# Patient Record
Sex: Male | Born: 1955
Health system: Southern US, Community
[De-identification: ages and names within clinical notes are randomized; demographics above are authoritative.]

## PROBLEM LIST (undated history)

## (undated) DIAGNOSIS — N400 Enlarged prostate without lower urinary tract symptoms: Secondary | ICD-10-CM

## (undated) DIAGNOSIS — F1021 Alcohol dependence, in remission: Secondary | ICD-10-CM

## (undated) DIAGNOSIS — R972 Elevated prostate specific antigen [PSA]: Secondary | ICD-10-CM

## (undated) DIAGNOSIS — C801 Malignant (primary) neoplasm, unspecified: Secondary | ICD-10-CM

## (undated) DIAGNOSIS — D126 Benign neoplasm of colon, unspecified: Secondary | ICD-10-CM

## (undated) DIAGNOSIS — K5790 Diverticulosis of intestine, part unspecified, without perforation or abscess without bleeding: Secondary | ICD-10-CM

## (undated) DIAGNOSIS — K219 Gastro-esophageal reflux disease without esophagitis: Secondary | ICD-10-CM

## (undated) DIAGNOSIS — K5792 Diverticulitis of intestine, part unspecified, without perforation or abscess without bleeding: Secondary | ICD-10-CM

## (undated) DIAGNOSIS — K648 Other hemorrhoids: Secondary | ICD-10-CM

## (undated) DIAGNOSIS — E785 Hyperlipidemia, unspecified: Secondary | ICD-10-CM

## (undated) HISTORY — DX: Diverticulosis of intestine, part unspecified, without perforation or abscess without bleeding: K57.90

## (undated) HISTORY — DX: Elevated prostate specific antigen (PSA): R97.20

## (undated) HISTORY — PX: PROSTATE BIOPSY: SHX241

## (undated) HISTORY — DX: Alcohol dependence, in remission: F10.21

## (undated) HISTORY — DX: Other hemorrhoids: K64.8

## (undated) HISTORY — DX: Benign prostatic hyperplasia without lower urinary tract symptoms: N40.0

## (undated) HISTORY — DX: Malignant (primary) neoplasm, unspecified: C80.1

## (undated) HISTORY — PX: POLYPECTOMY: SHX149

## (undated) HISTORY — PX: HEMORRHOID SURGERY: SHX153

## (undated) HISTORY — PX: SHOULDER ARTHROSCOPY: SHX128

## (undated) HISTORY — DX: Diverticulitis of intestine, part unspecified, without perforation or abscess without bleeding: K57.92

## (undated) HISTORY — DX: Hyperlipidemia, unspecified: E78.5

## (undated) HISTORY — DX: Gastro-esophageal reflux disease without esophagitis: K21.9

## (undated) HISTORY — PX: WISDOM TOOTH EXTRACTION: SHX21

## (undated) HISTORY — DX: Benign neoplasm of colon, unspecified: D12.6

---

## 1996-07-16 DIAGNOSIS — K5792 Diverticulitis of intestine, part unspecified, without perforation or abscess without bleeding: Secondary | ICD-10-CM

## 1996-07-16 HISTORY — DX: Diverticulitis of intestine, part unspecified, without perforation or abscess without bleeding: K57.92

## 1999-03-26 ENCOUNTER — Emergency Department (HOSPITAL_COMMUNITY): Admission: EM | Admit: 1999-03-26 | Discharge: 1999-03-26 | Payer: Self-pay | Admitting: Emergency Medicine

## 2002-11-06 ENCOUNTER — Emergency Department (HOSPITAL_COMMUNITY): Admission: EM | Admit: 2002-11-06 | Discharge: 2002-11-06 | Payer: Self-pay | Admitting: Emergency Medicine

## 2003-07-17 DIAGNOSIS — F1021 Alcohol dependence, in remission: Secondary | ICD-10-CM

## 2003-07-17 HISTORY — DX: Alcohol dependence, in remission: F10.21

## 2006-04-24 ENCOUNTER — Ambulatory Visit: Payer: Self-pay | Admitting: Internal Medicine

## 2007-06-17 ENCOUNTER — Encounter: Payer: Self-pay | Admitting: Internal Medicine

## 2007-11-05 ENCOUNTER — Encounter: Payer: Self-pay | Admitting: Internal Medicine

## 2007-11-06 ENCOUNTER — Encounter: Payer: Self-pay | Admitting: Internal Medicine

## 2007-12-01 ENCOUNTER — Encounter: Payer: Self-pay | Admitting: Gastroenterology

## 2007-12-01 ENCOUNTER — Ambulatory Visit: Payer: Self-pay | Admitting: Gastroenterology

## 2007-12-15 ENCOUNTER — Telehealth: Payer: Self-pay | Admitting: Gastroenterology

## 2007-12-15 DIAGNOSIS — D126 Benign neoplasm of colon, unspecified: Secondary | ICD-10-CM

## 2007-12-15 HISTORY — DX: Benign neoplasm of colon, unspecified: D12.6

## 2007-12-16 ENCOUNTER — Encounter: Payer: Self-pay | Admitting: Gastroenterology

## 2007-12-16 ENCOUNTER — Ambulatory Visit: Payer: Self-pay | Admitting: Gastroenterology

## 2007-12-17 ENCOUNTER — Encounter: Payer: Self-pay | Admitting: Gastroenterology

## 2008-07-16 HISTORY — PX: COLONOSCOPY: SHX174

## 2008-07-29 ENCOUNTER — Encounter: Payer: Self-pay | Admitting: Internal Medicine

## 2009-09-06 ENCOUNTER — Ambulatory Visit: Payer: Self-pay | Admitting: Pulmonary Disease

## 2009-11-07 ENCOUNTER — Ambulatory Visit: Payer: Self-pay | Admitting: Pulmonary Disease

## 2009-11-25 ENCOUNTER — Encounter: Payer: Self-pay | Admitting: Internal Medicine

## 2009-12-22 ENCOUNTER — Ambulatory Visit: Payer: Self-pay | Admitting: Internal Medicine

## 2010-02-24 ENCOUNTER — Telehealth (INDEPENDENT_AMBULATORY_CARE_PROVIDER_SITE_OTHER): Payer: Self-pay | Admitting: *Deleted

## 2010-03-13 ENCOUNTER — Encounter: Payer: Self-pay | Admitting: Internal Medicine

## 2010-03-27 ENCOUNTER — Ambulatory Visit: Payer: Self-pay | Admitting: Internal Medicine

## 2010-03-27 DIAGNOSIS — F988 Other specified behavioral and emotional disorders with onset usually occurring in childhood and adolescence: Secondary | ICD-10-CM | POA: Insufficient documentation

## 2010-03-27 DIAGNOSIS — N400 Enlarged prostate without lower urinary tract symptoms: Secondary | ICD-10-CM | POA: Insufficient documentation

## 2010-03-27 DIAGNOSIS — Z8719 Personal history of other diseases of the digestive system: Secondary | ICD-10-CM | POA: Insufficient documentation

## 2010-03-27 DIAGNOSIS — K219 Gastro-esophageal reflux disease without esophagitis: Secondary | ICD-10-CM | POA: Insufficient documentation

## 2010-08-15 NOTE — Assessment & Plan Note (Signed)
Summary: cpx//kn   Vital Signs:  Patient profile:   55 year old male Height:      72.75 inches Weight:      226 pounds BMI:     30.13 Temp:     98.3 degrees F oral Pulse rate:   84 / minute Resp:     14 per minute BP sitting:   134 / 82  (left arm) Cuff size:   large  Vitals Entered By: Shonna Chock CMA (March 27, 2010 9:50 AM) CC: CPX-not fasting ( had coffee and a danish) Comments Height recorded with shoes    Primary Care Provider:  Marga Melnick MD  CC:  CPX-not fasting ( had coffee and a danish).  History of Present Illness: Mr. "Walter Scott"  Huckeby is here for a physical; he is asymptomatic. He has been tested by Dr Vernell Leep & documented to have ADD. Ritalin is being considered as a therapeutic option.  Preventive Screening-Counseling & Management  Alcohol-Tobacco     Smoking Status: quit  Caffeine-Diet-Exercise     Does Patient Exercise: yes  Current Medications (verified): 1)  Avodart 0.5 Mg Caps (Dutasteride) .... Take 1 Tablet By Mouth Once A Day 2)  Protonix 40 Mg Tbec (Pantoprazole Sodium) .... Take 1 Tablet By Mouth Once A Day Before Dinner  Allergies (verified): 1)  ! Vyvanse (Lisdexamfetamine Dimesylate)  Past History:  Past Medical History: ADD, tested 2011, Dr Vernell Leep Diverticulitis, hx of 1998 Benign prostatic hypertrophy, elevated PSA, PMH of GERD Rcovering Alcoholic; [facility] 2005  Past Surgical History: Prostate biopsy 2006 & 2008, Dr Retta Diones; Colonoscopy 2009 : Diverticulosis  Family History: Father: died of ? 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 Eyes:  Denies blurring, double vision, and vision loss-both eyes. ENT:  Denies difficulty swallowing and hoarseness. CV:  Denies chest pain or discomfort, leg cramps with exertion, palpitations, shortness of breath with exertion, swelling of feet, and swelling of hands. Resp:  Denies cough and sputum productive. GI:  Denies abdominal pain, bloody stools, constipation, dark tarry stools, diarrhea, loss of appetite, and nausea; No dysphagia. GU:  Denies discharge, dysuria, and hematuria; Dr Retta Diones seen 2X/year. MS:  Denies joint pain, joint redness, joint swelling, low back pain, mid back pain, and thoracic pain. Derm:  Denies changes in nail beds, dryness, hair loss, and lesion(s). Neuro:  Denies numbness and tingling. Psych:  Denies anxiety, depression, easily angered, easily tearful, and irritability. Endo:  Denies cold intolerance, excessive hunger, excessive thirst, excessive urination, and heat intolerance. Heme:  Denies abnormal bruising, bleeding, and enlarge lymph nodes. Allergy:  Denies itching eyes and sneezing.  Physical Exam  General:  well-nourished; alert,appropriate and cooperative throughout examination Head:  Normocephalic and atraumatic without obvious abnormalities. No apparent alopecia  Eyes:  No corneal or conjunctival inflammation noted. EOMI. Perrla. Funduscopic exam benign, without hemorrhages, exudates or papilledema.No lid lag Ears:  External ear exam shows no significant lesions or deformities.  Otoscopic examination reveals clear canals, tympanic membranes are intact bilaterally without bulging, retraction, inflammation or discharge. Hearing is grossly normal bilaterally. Nose:  External nasal examination shows no deformity or  inflammation. Nasal mucosa are pink and moist without lesions or exudates. Mouth:  Oral mucosa and oropharynx without lesions or exudates.  Teeth in good repair. Neck:   No deformities, masses, or tenderness noted. No thyroid  Lungs:  Normal respiratory effort, chest expands symmetrically. Lungs are clear to auscultation, no crackles or wheezes. Heart:  Normal rate and regular rhythm. S1 and S2 normal without gallop, murmur, click, rub .S4 with slurring Abdomen:  Bowel sounds positive,abdomen soft and non-tender without masses, organomegaly or hernias noted. Genitalia:  Dr Retta Diones  Msk:  No deformity or scoliosis noted of thoracic or lumbar spine.   Pulses:  R and L carotid,radial,dorsalis pedis and posterior tibial pulses are full and equal bilaterally Extremities:  No clubbing, cyanosis,edema, or deformity noted with normal full range of motion of all joints.  No oncholysis Neurologic:  alert & oriented X3 and DTRs symmetrical and normal.  No tremor Skin:  Intact without suspicious lesions or rashes Cervical Nodes:  No lymphadenopathy noted Axillary Nodes:  No palpable lymphadenopathy Psych:  memory intact for recent and remote, normally interactive, good eye contact, not anxious appearing, and not depressed appearing.     Impression & Recommendations:  Problem # 1:  ROUTINE GENERAL MEDICAL EXAM@HEALTH  CARE FACL (ICD-V70.0)  Orders: EKG w/ Interpretation (93000)  Problem # 2:  ADD (ICD-314.00) DOCUMENTED by Dr Bascom Levels  Problem # 3:  GERD (ICD-530.81)  His updated medication list for this problem includes:    Protonix 40 Mg Tbec (Pantoprazole sodium) .Marland Kitchen... Take 1 tablet by mouth once a day  30 min pre b'fast  Problem # 4:  BENIGN PROSTATIC HYPERTROPHY (ICD-600.00) as per Dr Retta Diones His updated medication list for this problem includes:    Avodart 0.5 Mg Caps (Dutasteride) .Marland Kitchen... Take 1 tablet by mouth once a day  Complete Medication List: 1)  Avodart 0.5 Mg Caps (Dutasteride) .... Take 1 tablet by mouth once a day 2)  Protonix 40 Mg Tbec (Pantoprazole sodium) .... Take 1 tablet by mouth once a day  30 min pre b'fast 3)  Methylphenidate Hcl  10 Mg Tabs (Methylphenidate hcl) .Marland Kitchen.. 1 each am ; repeat if needed ( not within 8 hrs of at bedtime )  Other Orders: Tdap => 81yrs IM (14782) Admin 1st Vaccine (95621)  Patient Instructions: 1)  Consider baseline fasting labs: 2)  BMP; 3)  Hepatic Panel ; 4)  Lipid Panel; 5)  TSH ; 6)  CBC w/ Diff. 7)  Avoid foods high in acid (tomatoes, citrus juices, spicy foods). Avoid eating within two hours of lying down or before exercising. Do not over eat; try smaller more frequent meals. Elevate head of bed twelve inches when sleeping. 8)  Check your Blood Pressure regularly. If it is above: 135/85 ON AVERAGE  you should make an appointment. Prescriptions: METHYLPHENIDATE HCL 10 MG TABS (METHYLPHENIDATE HCL) 1 each am ; repeat if needed ( not within 8 hrs of at bedtime )  #60 x 0   Entered and Authorized by:   Marga Melnick MD   Signed by:   Marga Melnick MD on 03/27/2010   Method used:   Print then Give to Patient   RxID:   [id number] PROTONIX 40 MG TBEC (PANTOPRAZOLE SODIUM) Take 1 tablet by mouth once a day  30 min pre b'fast  #30 x 5   Entered and Authorized by:   Marga Melnick MD   Signed by:   Marga Melnick MD on 03/27/2010   Method used:   Print  then Give to Patient   RxID:   [id number]    Immunizations Administered:  Tetanus Vaccine:    Vaccine Type: Tdap    Site: right deltoid    Mfr: GlaxoSmithKline    Dose: 0.5 ml    Route: IM    Given by: Shonna Chock CMA    Exp. Date: 04/05/2012    Lot #: [id number]    VIS given: 06/02/08 version given March 27, 2010.

## 2010-08-15 NOTE — Letter (Signed)
Summary: [facility]  [facility]   Imported By: Lanelle Bal 03/29/2010 10:48:55  _____________________________________________________________________  External Attachment:    Type:   Image     Comment:   External Document

## 2010-08-15 NOTE — Progress Notes (Signed)
Summary: Request to change meds  Phone Note Call from Patient Call back at Home Phone [phone]   Caller: Patient Summary of Call: Patient spoke with his psych doctor and they decided Vyvanse is too much of a stimulant, patient said they think Ritlan at a middle dose will be appropriate.  Patient would like for Dr.Hopper to prescribe this, [facility] on [address]  Dr.Hopper please advise./Chrae Marlynn Perking CMA  February 24, 2010 2:24 PM   Follow-up for Phone Call        I left a message on VM for patient informing him that per Dr.Hopper:  The Psych doctor needs to send him specific recommendations in writing for him to prescribe this, unfortunately this is medical standards as per the [facility] Medical Board.  I left the phone and fax number for patient to contact us if any questions or concerns Follow-up by: Shonna Chock CMA,  February 24, 2010 5:27 PM     Appended Document: Request to change meds I received Dr Janey Greaser letter documenting ADD &  recommending Ritalin. I reviewed entire chart ; because of age , EKG & cardiac status evaluation indicated before starting Ritalin due to known  potential adverse effects of this stimulant , particularly in adults. This is standard required for these drugs in adults.

## 2010-08-15 NOTE — Assessment & Plan Note (Signed)
Summary: chronic cough x 15 years/self ref/apc   Visit Type:  Initial Consult Copy to:  self Primary Provider/Referring Provider:  Marga Melnick MD  CC:  Pt here for pulmonary consult. Pt c/o dry hacking cough x 15 years.  History of Present Illness: 53/M never smoker for evaluation of chronic cough x 15 years. This started when he was in  [place] in 1991 & he thinks there may have been mildew exposure, persisted during his stay in [place] & [place] & now in [place] last 10 years. he reports a full throat-ed cough, wores at night , no seasonal variation. He reports severe heartburn at least once/d, worse with [place] sauce, drinks coffee x 3 cups/d . Reports a post nasal drip again without seasonal variation.His  bride prompted him t o seek atention now. He has tried pulmicort/ albuterol nebs (his wife's children's) without benefit.  Current Medications (verified): 1)  Avodart 0.5 Mg Caps (Dutasteride) .... Take 1 Tablet By Mouth Once A Day  Allergies (verified): No Known Drug Allergies  Family History: none  Social History: Marital Status: married, lives with Reece Levy Children: yes Occupation: Minister/Educator  Review of Systems       The patient complains of non-productive cough and acid heartburn.  The patient denies shortness of breath with activity, shortness of breath at rest, productive cough, coughing up blood, chest pain, irregular heartbeats, indigestion, loss of appetite, weight change, abdominal pain, difficulty swallowing, sore throat, tooth/dental problems, headaches, nasal congestion/difficulty breathing through nose, sneezing, itching, ear ache, anxiety, depression, hand/feet swelling, joint stiffness or pain, rash, change in color of mucus, and fever.    Vital Signs:  Patient profile:   55 year old male Height:      71 inches Weight:      229.38 pounds BMI:     32.11 O2 Sat:      96 % on Room air Temp:     98.3 degrees F oral Pulse rate:   74 /  minute BP sitting:   118 / 90  (left arm) Cuff size:   regular  Vitals Entered By: Zackery Barefoot CMA (September 06, 2009 3:56 PM)  O2 Flow:  Room air CC: Pt here for pulmonary consult. Pt c/o dry hacking cough x 15 years Comments Medications reviewed with patient Verified contact number and pharmacy with patient Zackery Barefoot CMA  September 06, 2009 3:59 PM    Physical Exam  Additional Exam:  Gen. Pleasant, well-nourished, in no distress, normal affect ENT - no lesions, no post nasal drip Neck: No JVD, no thyromegaly, no carotid bruits Lungs: no use of accessory muscles, no dullness to percussion, clear without rales or rhonchi  Cardiovascular: Rhythm regular, heart sounds  normal, no murmurs or gallops, no peripheral edema Abdomen: soft and non-tender, no hepatosplenomegaly, BS normal. Musculoskeletal: No deformities, no cyanosis or clubbing Neuro:  alert, non focal     Impression & Recommendations:  Problem # 1:  COUGH (ICD-786.2)  Likely a combination of GERD & pper airway cough syndrome. Will use zyrtec for allergies Re-assess improvement in 6 wks, if none consider 1st gen anti-histaminic / decongestant combination.  Orders: Consultation Level III [phone]) Prescription Created Electronically [phone])  Problem # 2:  G E REFLUX (ICD-530.81)  His updated medication list for this problem includes:    Pantoprazole Sodium 40 Mg Tbec (Pantoprazole sodium) ..... Once daily before dinner  Orders: Consultation Level III (09811) Prescription Created Electronically [phone])  Medications Added to Medication List This Visit:  1)  Avodart 0.5 Mg Caps (Dutasteride) .... Take 1 tablet by mouth once a day 2)  Pantoprazole Sodium 40 Mg Tbec (Pantoprazole sodium) .... Once daily before dinner 3)  Zyrtec Allergy 10 Mg Tabs (Cetirizine hcl) .... Once daily  Patient Instructions: 1)  Copy sent to: Dr Alwyn Ren 2)  Please schedule a follow-up appointment in 6 weeks. 3)  Acid  supressant x 6 wks 4)  Zyrtec x 6 wks Prescriptions: ZYRTEC ALLERGY 10 MG TABS (CETIRIZINE HCL) once daily  #30 x 2   Entered and Authorized by:   Comer Locket Vassie Loll MD   Signed by:   Comer Locket Vassie Loll MD on 09/06/2009   Method used:   Electronically to        [facility]  [phone]* (retail)       [address] [place], [place]  [address]       Ph: [phone] or [phone]       Fax: [phone]   RxID:   [id number] PANTOPRAZOLE SODIUM 40 MG TBEC (PANTOPRAZOLE SODIUM) once daily before dinner  #30 x 2   Entered and Authorized by:   Comer Locket Vassie Loll MD   Signed by:   Comer Locket Vassie Loll MD on 09/06/2009   Method used:   Electronically to        [facility]  [phone]* (retail)       [address] [place], [place]  [address]       Ph: [phone] or [phone]       Fax: [phone]   RxID:   [id number]

## 2010-08-15 NOTE — Letter (Signed)
Summary: [facility]  [facility]   Imported By: Lanelle Bal 12/02/2009 09:26:37  _____________________________________________________________________  External Attachment:    Type:   Image     Comment:   External Document

## 2010-08-15 NOTE — Assessment & Plan Note (Signed)
Summary: add testing//kn   Vital Signs:  Patient profile:   55 year old male Height:      73 inches Weight:      229.0 pounds Pulse rate:   76 / minute Resp:     15 per minute BP sitting:   114 / 68  (left arm) Cuff size:   large  Vitals Entered By: Shonna Chock (December 22, 2009 11:51 AM) CC: ADD Testing  Comments **HEIGHT recorded with shoes on**   Primary Care Provider:  Marga Melnick MD  CC:  ADD Testing .  History of Present Illness: Walter Scott officially diagnosed ADHD 3 years ago. He was on meds for 3 months; "I didn't want to do it being in recovery". He is now seeing Vernell Leep now; he recommended Vyvanse. Black Box Warnings reviewed; he expresses concern about risks.His concerns were discussed.  Allergies (verified): No Known Drug Allergies  Review of Systems General:  Denies fatigue, loss of appetite, sleep disorder, and weight loss. CV:  Denies chest pain or discomfort, leg cramps with exertion, palpitations, and shortness of breath with exertion. GI:  Denies constipation, diarrhea, and excessive appetite. Neuro:  Denies numbness, tingling, and tremors. Psych:  Denies anxiety, depression, easily angered, easily tearful, irritability, and panic attacks. Endo:  Complains of heat intolerance; denies cold intolerance, excessive hunger, excessive thirst, and excessive urination.  Physical Exam  General:  well-nourished; alert,appropriate and cooperative throughout examination Eyes:  No corneal or conjunctival inflammation noted. No lid lag Heart:  Normal rate and regular rhythm. S1 and S2 normal without gallop, murmur, click, rub or other extra sounds. Neurologic:  alert & oriented X3 and DTRs symmetrical and normal.   No tremor  Skin:  Intact without suspicious lesions or rashes Psych:  Oriented X3, memory intact for recent and remote, normally interactive, good eye contact, not anxious appearing, and not depressed appearing.     Impression &  Recommendations:  Problem # 1:  ADHD (ICD-314.01)  Complete Medication List: 1)  Avodart 0.5 Mg Caps (Dutasteride) .... Take 1 tablet by mouth once a day 2)  Protonix 40 Mg Tbec (Pantoprazole sodium) .... Take 1 tablet by mouth once a day before dinner 3)  Vyvanse 30 Mg Caps (Lisdexamfetamine dimesylate) .Marland Kitchen.. 1 once daily  Patient Instructions: 1)  Report Warning Signs as discussed Prescriptions: VYVANSE 30 MG CAPS (LISDEXAMFETAMINE DIMESYLATE) 1 once daily  #30 x 0   Entered and Authorized by:   Marga Melnick MD   Signed by:   Marga Melnick MD on 12/22/2009   Method used:   Print then Give to Patient   RxID:   [id number]

## 2010-08-15 NOTE — Assessment & Plan Note (Signed)
Summary: 6 weeks/ mbw   Visit Type:  Follow-up Copy to:  self Primary Provider/Referring Provider:  Marga Melnick MD  CC:  Pt here for follow up. Pt c/o PND unchanged. Pt states cough is better and heartburn is less.  History of Present Illness: 54//M never smoker for evaluation of chronic cough x 15 years. This started when he was in  [place] in 1991 & he thinks there may have been mildew exposure, persisted during his stay in [place] & [place] & now in [place] last 10 years. he reports a full throat-ed cough, wores at night , no seasonal variation. He reports severe heartburn at least once/d, worse with [place] sauce, drinks coffee x 3 cups/d . Reports a post nasal drip again without seasonal variation.His  bride prompted him t o seek atention now. He has tried pulmicort/ albuterol nebs (his wife's children's) without benefit.  November 07, 2009 9:12 AM  cough 80% betterwith zyrtec & protonix, post nasal drip persists but cough better, heartburn almost gone  Current Medications (verified): 1)  Avodart 0.5 Mg Caps (Dutasteride) .... Take 1 Tablet By Mouth Once A Day 2)  Zyrtec Allergy 10 Mg Tabs (Cetirizine Hcl) .... Take 1 Tablet By Mouth Once A Day 3)  Protonix 40 Mg Tbec (Pantoprazole Sodium) .... Take 1 Tablet By Mouth Once A Day Before Dinner  Allergies (verified): No Known Drug Allergies  Past History:  Social History: Last updated: 09/06/2009 Marital Status: married, lives with Reece Levy Children: yes Occupation: Minister/Educator  Review of Systems       The patient complains of prolonged cough.  The patient denies anorexia, fever, weight loss, weight gain, vision loss, decreased hearing, hoarseness, chest pain, syncope, dyspnea on exertion, peripheral edema, headaches, hemoptysis, abdominal pain, melena, hematochezia, severe indigestion/heartburn, hematuria, muscle weakness, suspicious skin lesions, difficulty walking, depression, unusual weight change, and  abnormal bleeding.    Vital Signs:  Patient profile:   55 year old male Height:      71 inches Weight:      230 pounds O2 Sat:      94 % on Room air Temp:     97.8 degrees F oral Pulse rate:   59 / minute BP sitting:   124 / 84  (left arm) Cuff size:   regular  Vitals Entered By: Zackery Barefoot CMA (November 07, 2009 8:57 AM)  O2 Flow:  Room air CC: Pt here for follow up. Pt c/o PND unchanged. Pt states cough is better and heartburn is less Comments Medications reviewed with patient Verified contact number and pharmacy with patient Zackery Barefoot CMA  November 07, 2009 8:58 AM    Physical Exam  Additional Exam:  Gen. Pleasant, well-nourished, in no distress, normal affect ENT - no lesions, no post nasal drip Neck: No JVD, no thyromegaly, no carotid bruits Lungs: no use of accessory muscles, no dullness to percussion, clear without rales or rhonchi  Cardiovascular: Rhythm regular, heart sounds  normal, no murmurs or gallops, no peripheral edema Musculoskeletal: No deformities, no cyanosis or clubbing      Impression & Recommendations:  Problem # 1:  COUGH (ICD-786.2)  Combination of GERD & upper airway cough syndrome ct protonix x 3 months ct zyrtec D x 6 wks then stop  Orders: Est. Patient Level III (60454) Prescription Created Electronically [phone])  Medications Added to Medication List This Visit: 1)  Zyrtec Allergy 10 Mg Tabs (Cetirizine hcl) .... Take 1 tablet by mouth once a day 2)  Zyrtec-d Allergy & Congestion 5-120 Mg Xr12h-tab (Cetirizine-pseudoephedrine) .... Once daily 3)  Protonix 40 Mg Tbec (Pantoprazole sodium) .... Take 1 tablet by mouth once a day before dinner  Patient Instructions: 1)  Please schedule a follow-up appointment in 31/2 months. 2)  Stay on zyrtec D x 6 weeks 3)  Stay on protonix x 3 months 4)  Call if worse 5)  Please schedule a follow-up appointment in 2 months. Prescriptions: ZYRTEC-D ALLERGY & CONGESTION 5-120 MG XR12H-TAB  (CETIRIZINE-PSEUDOEPHEDRINE) once daily  #42 x 1   Entered and Authorized by:   Comer Locket. Vassie Loll MD   Signed by:   Comer Locket Vassie Loll MD on 11/07/2009   Method used:   Electronically to        [facility]  [phone]* (retail)       [address] [place], [place]  [address]       Ph: [phone] or [phone]       Fax: [phone]   RxID:   [id number]

## 2011-06-27 ENCOUNTER — Encounter: Payer: Self-pay | Admitting: Internal Medicine

## 2011-06-27 ENCOUNTER — Ambulatory Visit (INDEPENDENT_AMBULATORY_CARE_PROVIDER_SITE_OTHER): Payer: 59 | Admitting: Internal Medicine

## 2011-06-27 VITALS — BP 146/88 | HR 66 | Temp 98.2°F | Wt 230.6 lb

## 2011-06-27 DIAGNOSIS — J310 Chronic rhinitis: Secondary | ICD-10-CM

## 2011-06-27 DIAGNOSIS — R030 Elevated blood-pressure reading, without diagnosis of hypertension: Secondary | ICD-10-CM

## 2011-06-27 DIAGNOSIS — R000 Tachycardia, unspecified: Secondary | ICD-10-CM

## 2011-06-27 MED ORDER — METOPROLOL TARTRATE 25 MG PO TABS: 25.0000 mg | ORAL_TABLET | Freq: Two times a day (BID) | ORAL | Status: DC

## 2011-06-27 NOTE — Patient Instructions (Signed)
Blood Pressure Goal  Ideally is an AVERAGE < 135/85. This AVERAGE should be calculated from @ least 5-7 BP readings taken @ different times of day on different days of week. You should not respond to isolated BP readings , but rather the AVERAGE for that week. Filled blood pressure medicine if the average is greater than 135/85 or if you continue to have a tachycardia.To prevent tachycardia avoid stimulants such as decongestants, diet pills, nicotine, or caffeine (coffee, tea, cola, or chocolate) to excess.   Plain Mucinex for thick secretions ;force NON dairy fluids . Use a Neti pot daily as needed for sinus congestion . Flonase 1 spray in each nostril twice a day as needed. Use the "crossover" technique as discussed

## 2011-06-27 NOTE — Progress Notes (Signed)
  Subjective:    Patient ID: Walter Scott, male    DOB: 05-27-1956, 55 y.o.   MRN: [id number]  HPI 6 months ago and yesterday his blood pressure was found to be elevated at the dental office. He was told yesterday that "it was in the stroke risk range". He does have a history of mild blood pressure elevations in the past; he has no history of hypertension. He has never been  on antihypertensive medications.  His mother had hypertension. There is a history of heart attack in both sides of the family; those nails were over 55 at the time of their infarction.  He's had a recent respiratory tract infection and has been on generic Tessalon and Flonase. He is also been taking NyQuil.  He does have ADD but is not taking any medications for this.      Review of Systems He denies chest pain, palpitations, shortness of breath, or edema. He has noted some racing of his heart in the last 10 days. He's also been under increased stress related to job issues.  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 He denies symptoms of active rhinosinusitis or bronchitis.     Objective:   Physical Exam he appears healthy and well-nourished and in no acute distress.  Nasal, oral, and alert and neurologic exams are normal.  He has no lymphadenopathy about the neck or axilla  Pupils are equally round and reactive to light. Fundal exam reveals no significant hypertensive changes.  Chest is clear without rhonchi, rales or meals. If no increased work of breathing.  His heart rate is actually slow with a regular rhythm. He has an S4 with slight slurring. There no murmurs.  All pulses are intact and he has no bruits.  There is no aortic aneurysm or renal artery bruits.  Extremities revealed no cyanosis, clubbing, or edema.        Assessment & Plan:   #1 elevated blood pressure with significant long-term risk. There is a family history of hypertension. Blood pressure monitor is essential. If  blood pressure averages greater than 135/85; low-dose metoprolol will be initiated. This would also address her tachycardia. No dysrhythmias present clinically this time.  #2 rhinitis symptoms without rhinosinusitis. He should verify that he is not ingesting decongestants which can cause tachycardia and raise blood pressure  #3 exogenous stress; medication decline

## 2011-08-22 ENCOUNTER — Encounter: Payer: Self-pay | Admitting: Internal Medicine

## 2011-08-22 ENCOUNTER — Ambulatory Visit (INDEPENDENT_AMBULATORY_CARE_PROVIDER_SITE_OTHER): Payer: 59 | Admitting: Internal Medicine

## 2011-08-22 VITALS — BP 134/86 | HR 64 | Temp 98.4°F | Resp 12 | Ht 72.0 in | Wt 231.0 lb

## 2011-08-22 DIAGNOSIS — Z0000 Encounter for general adult medical examination without abnormal findings: Secondary | ICD-10-CM

## 2011-08-22 LAB — LIPID PANEL
Cholesterol: 258 mg/dL — ABNORMAL HIGH (ref 0–200)
HDL: 39.9 mg/dL
Total CHOL/HDL Ratio: 6
Triglycerides: 337 mg/dL — ABNORMAL HIGH (ref 0.0–149.0)
VLDL: 67.4 mg/dL — ABNORMAL HIGH (ref 0.0–40.0)

## 2011-08-22 LAB — HEPATIC FUNCTION PANEL
ALT: 55 U/L — ABNORMAL HIGH (ref 0–53)
AST: 33 U/L (ref 0–37)
Albumin: 4.3 g/dL (ref 3.5–5.2)
Alkaline Phosphatase: 57 U/L (ref 39–117)
Bilirubin, Direct: 0.1 mg/dL (ref 0.0–0.3)
Total Bilirubin: 0.8 mg/dL (ref 0.3–1.2)
Total Protein: 7.1 g/dL (ref 6.0–8.3)

## 2011-08-22 LAB — BASIC METABOLIC PANEL
BUN: 24 mg/dL — ABNORMAL HIGH (ref 6–23)
Chloride: 106 mEq/L (ref 96–112)
GFR: 69.08 mL/min (ref >60.00)
Potassium: 4.5 mEq/L (ref 3.5–5.1)
Sodium: 140 mEq/L (ref 135–145)

## 2011-08-22 LAB — CBC WITH DIFFERENTIAL/PLATELET
Basophils Relative: 0.4 % (ref 0.0–3.0)
Eosinophils Relative: 1 % (ref 0.0–5.0)
HCT: 44.4 % (ref 39.0–52.0)
Hemoglobin: 15.4 g/dL (ref 13.0–17.0)
Lymphs Abs: 3.1 10*3/uL (ref 0.7–4.0)
MCV: 89.6 fl (ref 78.0–100.0)
Monocytes Absolute: 0.6 10*3/uL (ref 0.1–1.0)
Monocytes Relative: 9.4 % (ref 3.0–12.0)
Neutro Abs: 3 10*3/uL (ref 1.4–7.7)
RBC: 4.95 Mil/uL (ref 4.22–5.81)
WBC: 6.8 10*3/uL (ref 4.5–10.5)

## 2011-08-22 LAB — LDL CHOLESTEROL, DIRECT: Direct LDL: 101.7 mg/dL

## 2011-08-22 LAB — TSH: TSH: 2.88 u[IU]/mL (ref 0.35–5.50)

## 2011-08-22 MED ORDER — RANITIDINE HCL 150 MG PO TABS: 150.0000 mg | ORAL_TABLET | Freq: Two times a day (BID) | ORAL | Status: DC

## 2011-08-22 NOTE — Patient Instructions (Signed)
Preventive Health Care: Exercise at least 30-45 minutes a day,  3-4 days a week.  Eat a low-fat diet with lots of fruits and vegetables, up to 7-9 servings per day. Consume less than 40 grams of sugar per day from foods & drinks with High Fructose Corn Sugar as # 1,2,3 or # 4 on label.  Your BP goal = AVERAGE < 135/85. Avoid ingestion of  excess salt/sodium.Cook with pepper & other spices . Use the salt substitute "No Salt"(unless your potassium has been elevated) OR the Mrs [name] products to season food @ the table. Avoid foods which taste salty or "vinegary" as their sodium contentet will be high.   Flonase1 spray in each nostril twice a day as needed. Use the "crossover" technique as discussed  The triggers for reflux  include stress; the "aspirin family" ; alcohol; peppermint; and caffeine (coffee, tea, cola, and chocolate). The aspirin family would include aspirin and the nonsteroidal agents such as ibuprofen &  Naproxen. Tylenol would not cause reflux. If having reflux ; food & drink should be avoided for @ least 2 hours before going to bed.

## 2011-08-22 NOTE — Progress Notes (Signed)
Subjective:    Patient ID: Walter Scott, male    DOB: 1956/02/08, 56 y.o.   MRN: [id number]  HPI Mr. Kau  is here for a physical; acute issues include cough due to post nasal drainage and hoarseness. He denies extrinsic symptoms such as sneezing oritchy watery eyes.      Review of Systems Patient reports no  vision/ hearing changes,anorexia, weight change, fever ,adenopathy, persistant / recurrent  swallowing issues, chest pain,palpitations, edema,persistant / recurrent cough, hemoptysis, dyspnea(rest, exertional, paroxysmal nocturnal), gastrointestinal  bleeding (melena, rectal bleeding), abdominal pain, excessive heart burn, GU symptoms( dysuria, hematuria, pyuria, voiding/incontinence  issues) syncope, focal weakness, memory loss,numbness & tingling, skin/hair/nail changes,depression, anxiety, abnormal bruising/bleeding, musculoskeletal symptoms/signs.  He has been monitoring his blood pressure at work; range is then 121/83-146/98. He is not calculated an average blood pressure. He wants to control any blood pressure elevations with lifestyle interventions. He does have some reflux symptoms at night.     Objective:   Physical Exam Gen.: Healthy and well-nourished in appearance. Alert, appropriate and cooperative throughout exam. Head: Normocephalic without obvious abnormalities; no alopecia  Eyes: No corneal or conjunctival inflammation noted. Pupils equal round reactive to light and accommodation. Fundal exam is benign without hemorrhages, exudate, papilledema. Extraocular motion intact. Vision grossly decreased OS. Ears: External  ear exam reveals no significant lesions or deformities. Canals clear .TMs normal. Hearing is grossly normal bilaterally. Nose: External nasal exam reveals no deformity or inflammation. Nasal mucosa are pink and moist. No lesions or exudates noted. Septum  normal  Mouth: Oral mucosa and oropharynx reveal no lesions or exudates. Teeth in good repair. Neck: No  deformities, masses, or tenderness noted. Range of motion & Thyroid normal. Lungs: Normal respiratory effort; chest expands symmetrically. Lungs are clear to auscultation without rales, wheezes, or increased work of breathing. Heart: Normal rate and rhythm. Normal S1 and S2. No gallop, click, or rub. No murmur. Abdomen: Bowel sounds normal; abdomen soft and nontender. No masses, organomegaly or hernias noted. Genitalia:Dr Dalstedt  .                                                                                   Musculoskeletal/extremities: No deformity or scoliosis noted of  the thoracic or lumbar spine. No clubbing, cyanosis, edema, or deformity noted. Range of motion  normal .Tone & strength  normal.Joints normal. Nail health  good. Vascular: Carotid, radial artery, dorsalis pedis and  posterior tibial pulses are full and equal. No bruits present. Neurologic: Alert and oriented x3. Deep tendon reflexes symmetrical and normal.          Skin: Intact without suspicious lesions or rashes. Lymph: No cervical or axillary lymphadenopathy present. Psych: Mood and affect are normal. Normally interactive  Assessment & Plan:  #1 comprehensive physical exam; no acute findings #2 see Problem List with Assessments & Recommendations  #3 cough in the context of perennial rhinitis Plan: see Orders

## 2012-08-22 ENCOUNTER — Encounter: Payer: Self-pay | Admitting: Internal Medicine

## 2012-08-22 ENCOUNTER — Ambulatory Visit (INDEPENDENT_AMBULATORY_CARE_PROVIDER_SITE_OTHER): Payer: BC Managed Care – PPO | Admitting: Internal Medicine

## 2012-08-22 VITALS — BP 126/90 | HR 67 | Temp 97.9°F | Resp 14 | Ht 73.0 in | Wt 229.0 lb

## 2012-08-22 DIAGNOSIS — Z0000 Encounter for general adult medical examination without abnormal findings: Secondary | ICD-10-CM

## 2012-08-22 DIAGNOSIS — R002 Palpitations: Secondary | ICD-10-CM

## 2012-08-22 DIAGNOSIS — R030 Elevated blood-pressure reading, without diagnosis of hypertension: Secondary | ICD-10-CM

## 2012-08-22 LAB — CBC WITH DIFFERENTIAL/PLATELET
Basophils Absolute: 0 10*3/uL (ref 0.0–0.1)
Eosinophils Absolute: 0.1 10*3/uL (ref 0.0–0.7)
Lymphocytes Relative: 49.5 % — ABNORMAL HIGH (ref 12.0–46.0)
MCHC: 35 g/dL (ref 30.0–36.0)
Monocytes Relative: 9.7 % (ref 3.0–12.0)
Neutro Abs: 2.9 10*3/uL (ref 1.4–7.7)
Neutrophils Relative %: 39.2 % — ABNORMAL LOW (ref 43.0–77.0)
Platelets: 189 10*3/uL (ref 150.0–400.0)
RDW: 12.8 % (ref 11.5–14.6)

## 2012-08-22 LAB — HEPATIC FUNCTION PANEL
AST: 35 U/L (ref 0–37)
Albumin: 4.2 g/dL (ref 3.5–5.2)
Alkaline Phosphatase: 55 U/L (ref 39–117)
Bilirubin, Direct: 0 mg/dL (ref 0.0–0.3)
Total Bilirubin: 0.6 mg/dL (ref 0.3–1.2)

## 2012-08-22 LAB — BASIC METABOLIC PANEL
BUN: 25 mg/dL — ABNORMAL HIGH (ref 6–23)
CO2: 25 mEq/L (ref 19–32)
Calcium: 9.1 mg/dL (ref 8.4–10.5)
Creatinine, Ser: 1.3 mg/dL (ref 0.4–1.5)
GFR: 63.77 mL/min (ref >60.00)
Glucose, Bld: 87 mg/dL (ref 70–99)
Sodium: 137 mEq/L (ref 135–145)

## 2012-08-22 LAB — T4, FREE: Free T4: 0.69 ng/dL (ref 0.60–1.60)

## 2012-08-22 LAB — LIPID PANEL
Cholesterol: 207 mg/dL — ABNORMAL HIGH (ref 0–200)
HDL: 32.8 mg/dL — ABNORMAL LOW
Total CHOL/HDL Ratio: 6
Triglycerides: 333 mg/dL — ABNORMAL HIGH (ref 0.0–149.0)
VLDL: 66.6 mg/dL — ABNORMAL HIGH (ref 0.0–40.0)

## 2012-08-22 LAB — MAGNESIUM: Magnesium: 2 mg/dL (ref 1.5–2.5)

## 2012-08-22 MED ORDER — FLUTICASONE PROPIONATE 50 MCG/ACT NA SUSP: 1.0000 | Freq: Two times a day (BID) | NASAL | Status: DC | PRN

## 2012-08-22 MED ORDER — RAMIPRIL 2.5 MG PO CAPS: 2.5000 mg | ORAL_CAPSULE | Freq: Every day | ORAL | Status: DC

## 2012-08-22 NOTE — Progress Notes (Signed)
Subjective:    Patient ID: Walter Scott, male    DOB: 1955-09-07, 57 y.o.   MRN: [id number]  HPI Walter Scott is here for a physical;acute issues are noted below.     Review of Systems He is on a modified heart healthy diet; he exercises > 45- 60  minutes 3-4 times per week without symptoms. Specifically he denies chest pain, palpitations, dyspnea, or claudication. Family history is negative for premature coronary disease. For approximately a month he's noted some brief palpitations (few seconds) after he gets into bed. He has not had the symptoms with the high level exercise noted above. In fact he recently completed a half marathon.  He also has some chronic postnasal drainage. He denies taking decongestants     Objective:   Physical Exam Gen.: Healthy and well-nourished in appearance. Alert, appropriate and cooperative throughout exam. Appears younger than stated age  Head: Normocephalic without obvious abnormalities;  no alopecia  Eyes: No corneal or conjunctival inflammation noted. Pupils equal round reactive to light and accommodation. Fundal exam is benign without hemorrhages, exudate, papilledema. Extraocular motion intact. Vision grossly normal. Ears: External  ear exam reveals no significant lesions or deformities. Canals clear .TMs normal. Hearing is grossly normal bilaterally. Nose: External nasal exam reveals no deformity or inflammation. Erythema R nares. No lesions or exudates noted.   Mouth: Oral mucosa and oropharynx reveal no lesions or exudates. Teeth in good repair. Neck: No deformities, masses, or tenderness noted. Range of motion & Thyroid normal. Lungs: Normal respiratory effort; chest expands symmetrically. Lungs are clear to auscultation without rales, wheezes, or increased work of breathing. Heart: Normal rate and rhythm. Normal S1 and S2. No gallop, click, or rub. Grade 1/2 over 6 systolic murmur  R base Abdomen: Bowel sounds normal; abdomen soft and nontender.  No masses, organomegaly or hernias noted. Genitalia: Dr Retta Diones                                    Musculoskeletal/extremities: No deformity or scoliosis noted of  the thoracic or lumbar spine. No clubbing, cyanosis, edema, or significant extremity  deformity noted. Range of motion normal .Tone & strength  normal.Joints normal . Nail health good. Able to lie down & sit up w/o help. Negative SLR bilaterally Vascular: Carotid, radial artery, dorsalis pedis and  posterior tibial pulses are full and equal. No bruits present. Neurologic: Alert and oriented x3. Deep tendon reflexes symmetrical and normal.  Skin: Intact without suspicious lesions or rashes. Lymph: No cervical, axillary lymphadenopathy present. Psych: Mood and affect are normal. Normally interactive                                                                                        Assessment & Plan:  #1 comprehensive physical exam  #2 palpitations at rest briefly at night. None with high level of exercise. As noted he is competing at high levels such as the half marathon. EKG does show some loss of the voltage in leads aVL and in the precordial leads of questionable significance. Despite the lack  of symptoms with exercise; I would recommend  a stress test prior to continuing the high level of physical activity.  #3 rhinitis; see recommendations in AVS  Plan: see Orders  & Recommendations

## 2012-08-22 NOTE — Patient Instructions (Addendum)
To prevent palpitations or premature beats, avoid stimulants such as decongestants, diet pills, nicotine, or caffeine (coffee, tea, cola, or chocolate) to excess.  Take the EKG to any emergency room or preop visits. There are nonspecific changes; as long as there is no new change these are not clinically significant . If the old EKG is not available for comparison; it may result in unnecessary hospitalization for observation with significant unnecessary expense.     Plain Mucinex (NOT D) for thick secretions ;force NON dairy fluids .   Nasal cleansing in the shower as discussed with lather of mild shampoo.After 10 seconds wash off lather while  exhaling through nostrils. Make sure that all residual soap is removed to prevent irritation.  Fluticasone 1 spray in each nostril twice a day as needed. Use the "crossover" technique into opposite nostril spraying toward opposite ear @ 45 degree angle, not straight up into nostril.  Use a Neti pot daily only  as needed for significant sinus congestion; going from open side to congested side . Plain Allegra (NOT D )  160 daily , Loratidine 10 mg , OR Zyrtec 10 mg @ bedtime  as needed for itchy eyes & sneezing.  Minimal Blood Pressure Goal= AVERAGE < 140/90;  Ideal is an AVERAGE < 135/85. This AVERAGE should be calculated from @ least 5-7 BP readings taken @ different times of day on different days of week. You should not respond to isolated BP readings , but rather the AVERAGE for that week .Please bring your  blood pressure cuff to office visits to verify that it is reliable.It  can also be checked against the blood pressure device at the pharmacy. Finger or wrist cuffs are not dependable; an arm cuff is.    If you activate My Chart; the results can be released to you as soon as they populate from the lab. If you choose not to use this program; the labs have to be reviewed, copied & mailed   causing a delay in getting the results to you.

## 2012-10-20 ENCOUNTER — Encounter: Payer: Self-pay | Admitting: Family Medicine

## 2012-10-20 ENCOUNTER — Ambulatory Visit (INDEPENDENT_AMBULATORY_CARE_PROVIDER_SITE_OTHER): Payer: BC Managed Care – PPO | Admitting: Family Medicine

## 2012-10-20 VITALS — BP 110/80 | HR 68 | Temp 98.3°F | Ht 72.5 in | Wt 230.2 lb

## 2012-10-20 DIAGNOSIS — J329 Chronic sinusitis, unspecified: Secondary | ICD-10-CM | POA: Insufficient documentation

## 2012-10-20 MED ORDER — PROMETHAZINE-DM 6.25-15 MG/5ML PO SYRP: 5.0000 mL | ORAL_SOLUTION | Freq: Four times a day (QID) | ORAL | Status: DC | PRN

## 2012-10-20 MED ORDER — AZITHROMYCIN 250 MG PO TABS: ORAL_TABLET | ORAL | Status: DC

## 2012-10-20 NOTE — Assessment & Plan Note (Signed)
New.  Start abx.  Cough meds prn.  Reviewed supportive care and red flags that should prompt return.  Pt expressed understanding and is in agreement w/ plan.  

## 2012-10-20 NOTE — Progress Notes (Signed)
  Subjective:    Patient ID: Walter Scott, male    DOB: 02-Dec-1955, 56 y.o.   MRN: [id number]  HPI URI- sxs started Thursday w/ sore throat (has improved), Friday developed cough.  'it's really in my chest', 'tighter than usual'.  Cough is not productive, sleep is difficult.  No fevers.  + sinus pain.  No ear pain.  + sick contacts.  No N/V/D.  Pt w/ hx of bronchitis.  Has taken Advil cold and sinus, Nyquil, Mucinex w/out relief.   Review of Systems For ROS see HPI     Objective:   Physical Exam  Vitals reviewed. Constitutional: He appears well-developed and well-nourished. No distress.  HENT:  Head: Normocephalic and atraumatic.  Right Ear: Tympanic membrane normal.  Left Ear: Tympanic membrane normal.  Nose: Mucosal edema and rhinorrhea present. Right sinus exhibits maxillary sinus tenderness and frontal sinus tenderness. Left sinus exhibits maxillary sinus tenderness and frontal sinus tenderness.  Mouth/Throat: Mucous membranes are normal. Oropharyngeal exudate and posterior oropharyngeal erythema present. No posterior oropharyngeal edema.  + PND  Eyes: Conjunctivae and EOM are normal. Pupils are equal, round, and reactive to light.  Neck: Normal range of motion. Neck supple.  Cardiovascular: Normal rate, regular rhythm and normal heart sounds.   Pulmonary/Chest: Effort normal and breath sounds normal. No respiratory distress. He has no wheezes.  + hacking cough  Lymphadenopathy:    He has no cervical adenopathy.  Skin: Skin is warm and dry.          Assessment & Plan:

## 2012-10-20 NOTE — Patient Instructions (Addendum)
Start the Zpack as directed Drink plenty of fluids Use the cough syrup as needed- this will cause drowsiness REST! Hang in there!!!

## 2012-12-12 ENCOUNTER — Encounter: Payer: Self-pay | Admitting: Gastroenterology

## 2013-02-25 ENCOUNTER — Telehealth: Payer: Self-pay | Admitting: Internal Medicine

## 2013-02-25 NOTE — Telephone Encounter (Signed)
Patient Information:  Caller Name: Toshiro  Phone: [phone]  Patient: Walter Scott, Walter Scott  Gender: Male  DOB: 1956/07/04  Age: 57 Years  PCP: Marga Melnick  Office Follow Up:  Does the office need to follow up with this patient?: No  Instructions For The Office: N/A  RN Note:  No pain when tragus pushed or earlobe moved.  Currently in [employer] until after office hours 02/25/13. Transferred to office scheduler for 02/26/13 appointment then decided not to schedule unless symptoms worsen.   Symptoms  Reason For Call & Symptoms: Right ear pain. Pain increases when lying down.  Reviewed Health History In EMR: Yes  Reviewed Medications In EMR: Yes  Reviewed Allergies In EMR: Yes  Reviewed Surgeries / Procedures: Yes  Date of Onset of Symptoms: 02/23/2013  Treatments Tried: swimmers ear alcohol drops  Treatments Tried Worked: No  Guideline(s) Used:  Earache  Disposition Per Guideline:   See Today in Office  Reason For Disposition Reached:   All other earaches (Exceptions: earache lasting < 1 hour, and earache from air travel)  Advice Given:  Pain Medicines:  For pain relief, you can take either acetaminophen, ibuprofen, or naproxen.  Call Back If  High fever, severe headache, or stiff neck occurs  You become worse.  Patient Refused Recommendation:  Patient Refused Appt, Patient Requests Appt At Later Date  Transferred to office scheduler for appointment 02/26/13.

## 2013-02-25 NOTE — Telephone Encounter (Signed)
Noted, patient indicated he will call for appointment if symptoms persist or progress

## 2013-04-08 ENCOUNTER — Other Ambulatory Visit: Payer: Self-pay | Admitting: Internal Medicine

## 2013-04-08 NOTE — Telephone Encounter (Signed)
Med filled.  

## 2013-07-14 ENCOUNTER — Encounter: Payer: Self-pay | Admitting: Gastroenterology

## 2013-08-10 ENCOUNTER — Encounter: Payer: Self-pay | Admitting: Gastroenterology

## 2013-08-10 ENCOUNTER — Ambulatory Visit (INDEPENDENT_AMBULATORY_CARE_PROVIDER_SITE_OTHER): Payer: 59 | Admitting: Gastroenterology

## 2013-08-10 ENCOUNTER — Other Ambulatory Visit: Payer: Self-pay | Admitting: Internal Medicine

## 2013-08-10 VITALS — BP 110/80 | HR 84 | Ht 71.25 in | Wt 228.0 lb

## 2013-08-10 DIAGNOSIS — Z860100 Personal history of colon polyps, unspecified: Secondary | ICD-10-CM

## 2013-08-10 DIAGNOSIS — K219 Gastro-esophageal reflux disease without esophagitis: Secondary | ICD-10-CM

## 2013-08-10 DIAGNOSIS — R059 Cough, unspecified: Secondary | ICD-10-CM

## 2013-08-10 DIAGNOSIS — R05 Cough: Secondary | ICD-10-CM

## 2013-08-10 MED ORDER — SOD PICOSULFATE-MAG OX-CIT ACD 10-3.5-12 MG-GM-GM PO PACK: 1.0000 | PACK | ORAL | Status: DC

## 2013-08-10 MED ORDER — LOSARTAN POTASSIUM 50 MG PO TABS: 50.0000 mg | ORAL_TABLET | Freq: Every day | ORAL | Status: DC

## 2013-08-10 NOTE — Progress Notes (Signed)
    History of Present Illness: This is a 58 year old male who relates a 7 year history of a cough. His cough occurs daily and is generally nonproductive but occasionally bring up what he feels is sinus drainage. He was recently evaluated by an allergist and I reviewed the notes. His allergist is concerned about LPR leading to a cough. Patient endorses postnasal drip and sinus drainage. He is maintained on an ACE inhibitor. He has been treated with daily Nexium for several years. His heartburn resolved but his cough has not improved. Denies weight loss, abdominal pain, constipation, diarrhea, change in stool caliber, melena, hematochezia, nausea, vomiting, dysphagia, chest pain.  Review of Systems: Pertinent positive and negative review of systems were noted in the above HPI section. All other review of systems were otherwise negative.  Current Medications, Allergies, Past Medical History, Past Surgical History, Family History and Social History were reviewed in [employer] record.  Physical Exam: General: Well developed , well nourished, no acute distress Head: Normocephalic and atraumatic Eyes:  sclerae anicteric, EOMI Ears: Normal auditory acuity Mouth: No deformity or lesions Neck: Supple, no masses or thyromegaly Lungs: Clear throughout to auscultation Heart: Regular rate and rhythm; no murmurs, rubs or bruits Abdomen: Soft, non tender and non distended. No masses, hepatosplenomegaly or hernias noted. Normal Bowel sounds Rectal: Deferred to colonoscopy Musculoskeletal: Symmetrical with no gross deformities  Skin: No lesions on visible extremities Pulses:  Normal pulses noted Extremities: No clubbing, cyanosis, edema or deformities noted Neurological: Alert oriented x 4, grossly nonfocal Cervical Nodes:  No significant cervical adenopathy Inguinal Nodes: No significant inguinal adenopathy Psychological:  Alert and cooperative. Normal mood and affect  Assessment  and Recommendations:  1. Personal history of adenomatous colon polyps. He was due for a five-year surveillance in June 2014. Schedule colonoscopy. The risks, benefits, and alternatives to colonoscopy with possible biopsy and possible polypectomy were discussed with the patient and they consent to proceed.   2. Chronic cough. Possible LPR however the cough may be secondary to his ACE inhibitor, ENT disorders or pulmonary disorders. Continue standard antireflux measures and Nexium 40 mg daily. I've advised him to contact Dr. Linna Darner to discontinue all ACE inhibitors and change to an alternative medication. The risks, benefits, and alternatives to endoscopy with possible biopsy and possible dilation were discussed with the patient and they consent to proceed.   3. GERD. See problem #2. I am not convinced his cough is related to LPR however we will plan to increase his Nexium to 40 mg twice a day following his upper endoscopy and assess response.

## 2013-08-10 NOTE — Patient Instructions (Addendum)
You have been scheduled for an endoscopy and colonoscopy with propofol. Please follow the written instructions given to you at your visit today. Please pick up your prep at the pharmacy within the next 1-3 days. If you use inhalers (even only as needed), please bring them with you on the day of your procedure. Your physician has requested that you go to www.startemmi.com and enter the access code given to you at your visit today. This web site gives a general overview about your procedure. However, you should still follow specific instructions given to you by our office regarding your preparation for the procedure.  Patient advised to avoid spicy, acidic, citrus, chocolate, mints, fruit and fruit juices.  Limit the intake of caffeine, alcohol and Soda.  Don't exercise too soon after eating.  Don't lie down within 3-4 hours of eating.  Elevate the head of your bed.  Please follow up with Dr. Linna Darner regarding changing your blood pressure medication.   Thank you for choosing me and [facility].  Pricilla Riffle. Dagoberto Ligas., MD., Marval Regal  cc: Carter Kitten, MD

## 2013-09-17 ENCOUNTER — Ambulatory Visit (AMBULATORY_SURGERY_CENTER): Payer: 59 | Admitting: Gastroenterology

## 2013-09-17 ENCOUNTER — Encounter: Payer: Self-pay | Admitting: Gastroenterology

## 2013-09-17 VITALS — BP 132/67 | HR 61 | Temp 97.5°F | Resp 16 | Ht 71.25 in | Wt 228.0 lb

## 2013-09-17 DIAGNOSIS — K219 Gastro-esophageal reflux disease without esophagitis: Secondary | ICD-10-CM

## 2013-09-17 DIAGNOSIS — Z86010 Personal history of colonic polyps: Secondary | ICD-10-CM

## 2013-09-17 DIAGNOSIS — K2270 Barrett's esophagus without dysplasia: Secondary | ICD-10-CM

## 2013-09-17 MED ORDER — ESOMEPRAZOLE MAGNESIUM 40 MG PO CPDR: 40.0000 mg | DELAYED_RELEASE_CAPSULE | Freq: Two times a day (BID) | ORAL | Status: DC

## 2013-09-17 MED ORDER — SODIUM CHLORIDE 0.9 % IV SOLN: 500.0000 mL | INTRAVENOUS | Status: DC

## 2013-09-17 NOTE — Op Note (Signed)
[place]  [address] [place], [id number]   ENDOSCOPY PROCEDURE REPORT  PATIENT: Walter, Scott.  III  MR#: [id number] BIRTHDATE: 10-31-1955 , 57  yrs. old GENDER: Male ENDOSCOPIST: Ladene Artist, MD, [facility] PROCEDURE DATE:  09/17/2013 PROCEDURE:  EGD w/ biopsy ASA CLASS:     Class II INDICATIONS:  History of esophageal reflux.   Barrett's screening. MEDICATIONS: MAC sedation, administered by CRNA, There was residual sedation effect present from prior procedure, and propofol (Diprivan) 130mg  IV TOPICAL ANESTHETIC: Cetacaine Spray DESCRIPTION OF PROCEDURE: After the risks benefits and alternatives of the procedure were thoroughly explained, informed consent was obtained.  The LB [id number] endoscope was introduced through the mouth and advanced to the second portion of the duodenum. Without limitations.  The instrument was slowly withdrawn as the mucosa was fully examined.  ESOPHAGUS: There was LA Class C esophagitis noted. There was evidence of suspected Barrett's esophagus in the lower third of the esophagus from 32 to 37 cm.  Multiple 4 quadrant biopsies were performed every 1 cm.  The esophagus was otherwise normal. STOMACH: The mucosa and folds of the stomach appeared normal. DUODENUM: The duodenal mucosa showed no abnormalities in the bulb and second portion of the duodenum.  Retroflexed views revealed a 4 cm hiatal hernia.  The scope was then withdrawn from the patient and the procedure completed.  COMPLICATIONS: There were no complications. ENDOSCOPIC IMPRESSION: 1.   LA Class C esophagitis 2.   Suspected Barrett's esophagus; multiple biopsies 3.   Hiatal hernia  RECOMMENDATIONS: 1.  Await pathology results 2.  Anti-reflux regimen, 4 " bedblocks 3.  PPI bid: Neixum 40 mg po bid, 1 year of refills 4.  Endoscopy in 3 years if no dysplasia noted 5.  Office appt in about 6 weeks   eSigned:  Ladene Artist, MD, [facility] 09/17/2013 3:51  PM

## 2013-09-17 NOTE — Progress Notes (Signed)
Called to room to assist during endoscopic procedure.  Patient ID and intended procedure confirmed with present staff. Received instructions for my participation in the procedure from the performing physician.  

## 2013-09-17 NOTE — Op Note (Signed)
[place]  [address] [place], [id number]   COLONOSCOPY PROCEDURE REPORT  PATIENT: Walter Scott, Walter Scott.  III  MR#: [id number] BIRTHDATE: 12-27-1955 , 57  yrs. old GENDER: Male ENDOSCOPIST: Ladene Artist, MD, [facility] PROCEDURE DATE:  09/17/2013 PROCEDURE:   Colonoscopy, surveillance First Screening Colonoscopy - Avg.  risk and is 50 yrs.  old or older - No.  Prior Negative Screening - Now for repeat screening. N/A  History of Adenoma - Now for follow-up colonoscopy & has been > or = to 3 yrs.  Yes hx of adenoma.  Has been 3 or more years since last colonoscopy.  Polyps Removed Today? No.  Recommend repeat exam, <10 yrs? Yes.  High risk (family or personal hx). ASA CLASS:   Class II INDICATIONS:Patient's personal history of adenomatous colon polyps.  MEDICATIONS: MAC sedation, administered by CRNA and propofol (Diprivan) 250mg  IV  DESCRIPTION OF PROCEDURE:   After the risks benefits and alternatives of the procedure were thoroughly explained, informed consent was obtained.  A digital rectal exam revealed no abnormalities of the rectum.   The LB [id number] endoscope was introduced through the anus and advanced to the cecum, which was identified by both the appendix and ileocecal valve. No adverse events experienced.   The quality of the prep was Prepopik good  The instrument was then slowly withdrawn as the colon was fully examined.  COLON FINDINGS: Mild diverticulosis was noted in the transverse colon.   Moderate diverticulosis was noted in the descending colon and sigmoid colon.   The colon was otherwise normal.  There was no diverticulosis, inflammation, polyps or cancers unless previously stated.  Retroflexed views revealed moderate internal hemorrhoids. The time to cecum=3 minutes 53 seconds.  Withdrawal time=11 minutes 26 seconds.  The scope was withdrawn and the procedure completed. COMPLICATIONS: There were no complications.  ENDOSCOPIC  IMPRESSION: 1.   Mild diverticulosis in the transverse colon 2.   Moderate diverticulosis in the descending colon and sigmoid colon 3.   Moderate internal hemorrhoids  RECOMMENDATIONS: 1.  High fiber diet with liberal fluid intake. 2.  Repeat Colonoscopy in 5 years.  eSigned:  Ladene Artist, MD, [facility] 09/17/2013 3:35 PM

## 2013-09-17 NOTE — Patient Instructions (Addendum)
YOU HAD AN ENDOSCOPIC PROCEDURE TODAY AT THE [facility]: Refer to the procedure report that was given to you for any specific questions about what was found during the examination.  If the procedure report does not answer your questions, please call your gastroenterologist to clarify.  If you requested that your care partner not be given the details of your procedure findings, then the procedure report has been included in a sealed envelope for you to review at your convenience later.  YOU SHOULD EXPECT: Some feelings of bloating in the abdomen. Passage of more gas than usual.  Walking can help get rid of the air that was put into your GI tract during the procedure and reduce the bloating. If you had a lower endoscopy (such as a colonoscopy or flexible sigmoidoscopy) you may notice spotting of blood in your stool or on the toilet paper. If you underwent a bowel prep for your procedure, then you may not have a normal bowel movement for a few days.  DIET: Your first meal following the procedure should be a light meal and then it is ok to progress to your normal diet.  A half-sandwich or bowl of soup is an example of a good first meal.  Heavy or fried foods are harder to digest and may make you feel nauseous or bloated.  Likewise meals heavy in dairy and vegetables can cause extra gas to form and this can also increase the bloating.  Drink plenty of fluids but you should avoid alcoholic beverages for 24 hours.  ACTIVITY: Your care partner should take you home directly after the procedure.  You should plan to take it easy, moving slowly for the rest of the day.  You can resume normal activity the day after the procedure however you should NOT DRIVE or use heavy machinery for 24 hours (because of the sedation medicines used during the test).    SYMPTOMS TO REPORT IMMEDIATELY: A gastroenterologist can be reached at any hour.  During normal business hours, 8:30 AM to 5:00 PM Monday through Friday,  call [phone].  After hours and on weekends, please call the GI answering service at [phone] who will take a message and have the physician on call contact you.   Following lower endoscopy (colonoscopy or flexible sigmoidoscopy):  Excessive amounts of blood in the stool  Significant tenderness or worsening of abdominal pains  Swelling of the abdomen that is new, acute  Fever of 100F or higher  Following upper endoscopy (EGD)  Vomiting of blood or coffee ground material  New chest pain or pain under the shoulder blades  Painful or persistently difficult swallowing  New shortness of breath  Fever of 100F or higher  Black, tarry-looking stools  FOLLOW UP: If any biopsies were taken you will be contacted by phone or by letter within the next 1-3 weeks.  Call your gastroenterologist if you have not heard about the biopsies in 3 weeks.  Our staff will call the home number listed on your records the next business day following your procedure to check on you and address any questions or concerns that you may have at that time regarding the information given to you following your procedure. This is a courtesy call and so if there is no answer at the home number and we have not heard from you through the emergency physician on call, we will assume that you have returned to your regular daily activities without incident.  SIGNATURES/CONFIDENTIALITY: You and/or your care   partner have signed paperwork which will be entered into your electronic medical record.  These signatures attest to the fact that that the information above on your After Visit Summary has been reviewed and is understood.  Full responsibility of the confidentiality of this discharge information lies with you and/or your care-partner.  Please read handouts about diverticulosis, high fiber diets, hemorrhoids, esophagitis, Barretts and hiatal hernias  Await pathology  Office appointment in 73 weeks- Dr. Lynne Leader office  nurse will call you to set that up  Anti-reflux regimen, please use 4 inch bedblocks- see handout for other recommendations  Your prescription was sent to your pharmacy- please taken 1 tablet 30 minutes before breakfast and dinner

## 2013-09-17 NOTE — Progress Notes (Signed)
Report to pacu rn, vss, bbs=clear 

## 2013-09-18 ENCOUNTER — Telehealth: Payer: Self-pay

## 2013-09-18 NOTE — Telephone Encounter (Signed)
  Follow up Call-  Call back number 09/17/2013  Post procedure Call Back phone  # [phone]  Permission to leave phone message Yes     Patient questions:  Do you have a fever, pain , or abdominal swelling? no Pain Score  0 *  Have you tolerated food without any problems? yes  Have you been able to return to your normal activities? yes  Do you have any questions about your discharge instructions: Diet   no Medications  no Follow up visit  no  Do you have questions or concerns about your Care? no  Actions: * If pain score is 4 or above: No action needed, pain <4.

## 2013-09-27 ENCOUNTER — Encounter: Payer: Self-pay | Admitting: Gastroenterology

## 2013-12-24 ENCOUNTER — Telehealth: Payer: Self-pay

## 2013-12-24 MED ORDER — PANTOPRAZOLE SODIUM 40 MG PO TBEC: 40.0000 mg | DELAYED_RELEASE_TABLET | Freq: Two times a day (BID) | ORAL | Status: DC

## 2013-12-24 NOTE — Telephone Encounter (Signed)
Received fax from [facility] stating generic Nexium is not covered under insurance plan and will need PA. Called [facility] and to do PA and they state Nexium brand or generic is a plan exclusion and will not be covered at all. Called patient to inform him of the insurance change and to notify him we will send in an alternative PPI that is on his formulary with his insurance. Patient agreed and did state he has not tried any other medications in place of Nexium. Told him I will send in another medication but if this medication does not work for his reflux symptoms, he is to call our office back . Pt agreed and verbalized understanding.

## 2014-05-11 ENCOUNTER — Other Ambulatory Visit: Payer: Self-pay | Admitting: Internal Medicine

## 2014-05-26 ENCOUNTER — Other Ambulatory Visit: Payer: Self-pay | Admitting: Internal Medicine

## 2014-09-20 ENCOUNTER — Telehealth: Payer: Self-pay | Admitting: Internal Medicine

## 2014-09-20 NOTE — Telephone Encounter (Signed)
Contacted PT to make new pt appt for Dr. Larose Kells- lm on vm with direct contact info

## 2015-01-19 ENCOUNTER — Encounter: Payer: Self-pay | Admitting: Gastroenterology

## 2016-08-14 ENCOUNTER — Encounter: Payer: Self-pay | Admitting: Gastroenterology

## 2017-01-29 DIAGNOSIS — R0982 Postnasal drip: Secondary | ICD-10-CM | POA: Diagnosis not present

## 2017-01-29 DIAGNOSIS — R05 Cough: Secondary | ICD-10-CM | POA: Diagnosis not present

## 2017-01-29 DIAGNOSIS — Z1389 Encounter for screening for other disorder: Secondary | ICD-10-CM | POA: Diagnosis not present

## 2017-01-29 DIAGNOSIS — E8881 Metabolic syndrome: Secondary | ICD-10-CM | POA: Diagnosis not present

## 2017-01-29 DIAGNOSIS — K219 Gastro-esophageal reflux disease without esophagitis: Secondary | ICD-10-CM | POA: Diagnosis not present

## 2017-02-13 ENCOUNTER — Telehealth: Payer: Self-pay | Admitting: *Deleted

## 2017-02-13 NOTE — Telephone Encounter (Signed)
Received request for Medical Records from [facility], forwarded to [place] for email/scan/SLS 08/01

## 2017-03-04 DIAGNOSIS — L570 Actinic keratosis: Secondary | ICD-10-CM | POA: Diagnosis not present

## 2017-07-29 ENCOUNTER — Ambulatory Visit (INDEPENDENT_AMBULATORY_CARE_PROVIDER_SITE_OTHER): Payer: BLUE CROSS/BLUE SHIELD | Admitting: Orthopedic Surgery

## 2017-07-29 ENCOUNTER — Ambulatory Visit (INDEPENDENT_AMBULATORY_CARE_PROVIDER_SITE_OTHER): Payer: BLUE CROSS/BLUE SHIELD

## 2017-07-29 ENCOUNTER — Encounter (INDEPENDENT_AMBULATORY_CARE_PROVIDER_SITE_OTHER): Payer: Self-pay | Admitting: Orthopedic Surgery

## 2017-07-29 VITALS — Ht 71.0 in | Wt 230.0 lb

## 2017-07-29 DIAGNOSIS — M7541 Impingement syndrome of right shoulder: Secondary | ICD-10-CM | POA: Diagnosis not present

## 2017-07-29 DIAGNOSIS — M25511 Pain in right shoulder: Secondary | ICD-10-CM

## 2017-07-29 DIAGNOSIS — G8929 Other chronic pain: Secondary | ICD-10-CM | POA: Diagnosis not present

## 2017-07-29 MED ORDER — LIDOCAINE HCL 1 % IJ SOLN
5.0000 mL | INTRAMUSCULAR | Status: AC | PRN
  Administered 2017-07-29: 5 mL

## 2017-07-29 MED ORDER — METHYLPREDNISOLONE ACETATE 40 MG/ML IJ SUSP
40.0000 mg | INTRAMUSCULAR | Status: AC | PRN
  Administered 2017-07-29: 40 mg via INTRA_ARTICULAR

## 2017-07-29 NOTE — Progress Notes (Signed)
Office Visit Note   Patient: Walter Scott           Date of Birth: May 28, 1956           MRN: [id number] Visit Date: 07/29/2017              Requested by: Marton Redwood, MD [id number] [place], [place] [address] PCP: Marton Redwood, MD  Chief Complaint  Patient presents with  . Right Shoulder - Pain      HPI: Patient is a 62 year old gentleman who has had increased pain after playing paddle tennis.  Patient denies any acute injury while playing states that the pain usually resolves with time at this time the shoulder symptoms have not resolved.  Patient is status post left shoulder arthroscopic debridement years ago which have completely relieved his symptoms.  Assessment & Plan: Visit Diagnoses:  1. Chronic right shoulder pain   2. Impingement syndrome of right shoulder     Plan: Patient underwent a subacromial injection he will give this 2 weeks.  If he is still symptomatic he will call and we will set him up for an MRI scan of the right shoulder.  Patient was given instructions for internal and external rotation strengthening and scapular stabilization he may use anti-inflammatories as needed.  Follow-Up Instructions: Return if symptoms worsen or fail to improve.   Ortho Exam  Patient is alert, oriented, no adenopathy, well-dressed, normal affect, normal respiratory effort. Examination patient has decreased abduction and flexion of the right shoulder compared to the left.  The biceps tendon is nontender to palpation the [facility] joint is nontender to palpation he does have pain with Neer and Hawkins impingement test and pain with drop arm test.  Imaging: Xr Shoulder Right  Result Date: 07/29/2017 3 view radiographs of the right shoulder shows a congruent glenohumeral joint.  There is slight decreased joint space in the subacromial space with a slight hooked acromion.  There is mild arthritic changes of the acromioclavicular joint.  The lung field is clear.  No images are  attached to the encounter.  Labs: No results found for: HGBA1C, ESRSEDRATE, CRP, LABURIC, REPTSTATUS, GRAMSTAIN, CULT, LABORGA  @LABSALLVALUES (HGBA1)@  Body mass index is 32.08 kg/m.  Orders:  Orders Placed This Encounter  Procedures  . XR Shoulder Right   No orders of the defined types were placed in this encounter.    Procedures: Large Joint Inj: R subacromial bursa on 07/29/2017 9:12 AM Indications: diagnostic evaluation and pain Details: 22 G 1.5 in needle, posterior approach  Arthrogram: No  Medications: 5 mL lidocaine 1 %; 40 mg methylPREDNISolone acetate 40 MG/ML Outcome: tolerated well, no immediate complications Procedure, treatment alternatives, risks and benefits explained, specific risks discussed. Consent was given by the patient. Immediately prior to procedure a time out was called to verify the correct patient, procedure, equipment, support staff and site/side marked as required. Patient was prepped and draped in the usual sterile fashion.      Clinical Data: No additional findings.  ROS:  All other systems negative, except as noted in the HPI. Review of Systems  Objective: Vital Signs: Ht 5\' 11"  (1.803 m)   Wt 230 lb (104.3 kg)   BMI 32.08 kg/m   Specialty Comments:  No specialty comments available.  PMFS History: Patient Active Problem List   Diagnosis Date Noted  . Impingement syndrome of right shoulder 07/29/2017  . Sinusitis 10/20/2012  . ADD 03/27/2010  . GERD 03/27/2010  . BENIGN PROSTATIC HYPERTROPHY 03/27/2010  .  DIVERTICULITIS, HX OF 03/27/2010   Past Medical History:  Diagnosis Date  . Alcoholism in remission [facility]) 2005   Fellowship [employer]   . BPH with elevated PSA    Dr Diona Fanti  . Diverticulitis [place]  . Diverticulosis   . GERD (gastroesophageal reflux disease)   . HLD (hyperlipidemia)   . HTN (hypertension)   . Internal hemorrhoids   . Tubular adenoma of colon 12/2007    Family History  Problem  Relation Age of Onset  . Hypertension Mother   . Breast cancer Mother   . Heart attack Maternal Uncle        after 55  . Heart attack Paternal Grandfather        after 101  . Hypertension Sister   . Diabetes Maternal Uncle   . Stroke Neg Hx     Past Surgical History:  Procedure Laterality Date  . COLONOSCOPY  2010   Diverticulosis; [facility]  . HEMORRHOID SURGERY    . PROSTATE BIOPSY     07/2004, 2008; Dr Diona Fanti  . SHOULDER ARTHROSCOPY Left   . WISDOM TOOTH EXTRACTION     Social History   Occupational History  . Occupation: minister  Tobacco Use  . Smoking status: Never Smoker  . Smokeless tobacco: Never Used  Substance and Sexual Activity  . Alcohol use: No  . Drug use: Yes    Types: Marijuana    Comment: None since 1993  . Sexual activity: Not on file

## 2017-08-02 DIAGNOSIS — Z0000 Encounter for general adult medical examination without abnormal findings: Secondary | ICD-10-CM | POA: Diagnosis not present

## 2017-08-02 DIAGNOSIS — Z1212 Encounter for screening for malignant neoplasm of rectum: Secondary | ICD-10-CM | POA: Diagnosis not present

## 2017-08-02 DIAGNOSIS — Z125 Encounter for screening for malignant neoplasm of prostate: Secondary | ICD-10-CM | POA: Diagnosis not present

## 2017-08-09 DIAGNOSIS — E7849 Other hyperlipidemia: Secondary | ICD-10-CM | POA: Diagnosis not present

## 2017-08-09 DIAGNOSIS — R0982 Postnasal drip: Secondary | ICD-10-CM | POA: Diagnosis not present

## 2017-08-09 DIAGNOSIS — R8290 Unspecified abnormal findings in urine: Secondary | ICD-10-CM | POA: Diagnosis not present

## 2017-08-09 DIAGNOSIS — Z0000 Encounter for general adult medical examination without abnormal findings: Secondary | ICD-10-CM | POA: Diagnosis not present

## 2017-08-09 DIAGNOSIS — R05 Cough: Secondary | ICD-10-CM | POA: Diagnosis not present

## 2017-08-09 DIAGNOSIS — Z1389 Encounter for screening for other disorder: Secondary | ICD-10-CM | POA: Diagnosis not present

## 2017-09-25 ENCOUNTER — Telehealth (INDEPENDENT_AMBULATORY_CARE_PROVIDER_SITE_OTHER): Payer: Self-pay | Admitting: *Deleted

## 2017-09-25 ENCOUNTER — Other Ambulatory Visit (INDEPENDENT_AMBULATORY_CARE_PROVIDER_SITE_OTHER): Payer: Self-pay | Admitting: Orthopedic Surgery

## 2017-09-25 DIAGNOSIS — M7541 Impingement syndrome of right shoulder: Secondary | ICD-10-CM

## 2017-09-25 DIAGNOSIS — M25511 Pain in right shoulder: Principal | ICD-10-CM

## 2017-09-25 DIAGNOSIS — G8929 Other chronic pain: Secondary | ICD-10-CM

## 2017-09-25 NOTE — Telephone Encounter (Signed)
Pt called stating was in January and saw Dr. Sharol Given with pain in right shoulder and states that he is symptomatic and would like to proceed with MRI of right shoulder. I read Dr. Sharol Given notes and it states If he is still symptomatic he will call and we will set him up for an MRI scan of the right shoulder, order has been placed and pt aware.

## 2017-10-11 ENCOUNTER — Ambulatory Visit
Admission: RE | Admit: 2017-10-11 | Discharge: 2017-10-11 | Disposition: A | Discharge status: Home / Self Care | Payer: BLUE CROSS/BLUE SHIELD | Source: Ambulatory Visit | Attending: Orthopedic Surgery | Admitting: Orthopedic Surgery

## 2017-10-11 DIAGNOSIS — M25511 Pain in right shoulder: Secondary | ICD-10-CM | POA: Diagnosis not present

## 2017-10-11 DIAGNOSIS — M7541 Impingement syndrome of right shoulder: Secondary | ICD-10-CM

## 2017-10-11 DIAGNOSIS — G8929 Other chronic pain: Secondary | ICD-10-CM

## 2017-10-14 ENCOUNTER — Encounter (INDEPENDENT_AMBULATORY_CARE_PROVIDER_SITE_OTHER): Payer: Self-pay | Admitting: Orthopedic Surgery

## 2017-10-14 ENCOUNTER — Ambulatory Visit (INDEPENDENT_AMBULATORY_CARE_PROVIDER_SITE_OTHER): Payer: BLUE CROSS/BLUE SHIELD | Admitting: Orthopedic Surgery

## 2017-10-14 VITALS — Ht 71.0 in | Wt 230.0 lb

## 2017-10-14 DIAGNOSIS — M7541 Impingement syndrome of right shoulder: Secondary | ICD-10-CM | POA: Diagnosis not present

## 2017-10-14 NOTE — Progress Notes (Signed)
Office Visit Note   Patient: Walter Scott           Date of Birth: 1956/05/21           MRN: [id number] Visit Date: 10/14/2017              Requested by: Marton Redwood, MD [address] [place], [place] [address] PCP: Marton Redwood, MD  Chief Complaint  Patient presents with  . Right Shoulder - Follow-up    MRI review right shoulder       HPI: Patient is a 62 year old gentleman who presents in follow-up for impingement symptoms right shoulder.  Patient states that the internal and external rotation strengthening and scapular stabilization has helped however he still cannot play sports with his right arm and cannot sleep due to right shoulder pain.  Assessment & Plan: Visit Diagnoses:  1. Impingement syndrome of right shoulder     Plan: Discussed options with conservative versus arthroscopic intervention.  Patient states he would like to proceed with arthroscopy discussed that this should help about 75%.  Patient will call to schedule.  Follow-Up Instructions: Return if symptoms worsen or fail to improve.   Ortho Exam  Patient is alert, oriented, no adenopathy, well-dressed, normal affect, normal respiratory effort. Examination patient has pain with Neer Hawkins impingement test he has no redness no cellulitis the biceps is nontender to palpation.  Review of the MRI scan shows tendinopathy of the supraspinatus and subscapularis as well as biceps tendon.  There is AC arthropathy there is some thickening of the glenohumeral ligament as well as some degenerative changes of the articular surface.  No full-thickness rotator cuff tear.  Imaging: No results found. No images are attached to the encounter.  Labs: No results found for: HGBA1C, ESRSEDRATE, CRP, LABURIC, REPTSTATUS, GRAMSTAIN, CULT, LABORGA  @LABSALLVALUES (HGBA1)@  Body mass index is 32.08 kg/m.  Orders:  No orders of the defined types were placed in this encounter.  No orders of the defined types were  placed in this encounter.    Procedures: No procedures performed  Clinical Data: No additional findings.  ROS:  All other systems negative, except as noted in the HPI. Review of Systems  Objective: Vital Signs: Ht 5\' 11"  (1.803 m)   Wt 230 lb (104.3 kg)   BMI 32.08 kg/m   Specialty Comments:  No specialty comments available.  PMFS History: Patient Active Problem List   Diagnosis Date Noted  . Impingement syndrome of right shoulder 07/29/2017  . Sinusitis 10/20/2012  . ADD 03/27/2010  . GERD 03/27/2010  . BENIGN PROSTATIC HYPERTROPHY 03/27/2010  . DIVERTICULITIS, HX OF 03/27/2010   Past Medical History:  Diagnosis Date  . Alcoholism in remission [facility]) 2005   [facility] [employer]   . BPH with elevated PSA    Dr Diona Fanti  . Diverticulitis [place]  . Diverticulosis   . GERD (gastroesophageal reflux disease)   . HLD (hyperlipidemia)   . HTN (hypertension)   . Internal hemorrhoids   . Tubular adenoma of colon 12/2007    Family History  Problem Relation Age of Onset  . Hypertension Mother   . Breast cancer Mother   . Heart attack Maternal Uncle        after 55  . Heart attack Paternal Grandfather        after 33  . Hypertension Sister   . Diabetes Maternal Uncle   . Stroke Neg Hx     Past Surgical History:  Procedure  Laterality Date  . COLONOSCOPY  2010   Diverticulosis; [facility]  . HEMORRHOID SURGERY    . PROSTATE BIOPSY     07/2004, 2008; Dr Diona Fanti  . SHOULDER ARTHROSCOPY Left   . WISDOM TOOTH EXTRACTION     Social History   Occupational History  . Occupation: minister  Tobacco Use  . Smoking status: Never Smoker  . Smokeless tobacco: Never Used  Substance and Sexual Activity  . Alcohol use: No  . Drug use: Yes    Types: Marijuana    Comment: None since 1993  . Sexual activity: Not on file

## 2017-10-31 DIAGNOSIS — N401 Enlarged prostate with lower urinary tract symptoms: Secondary | ICD-10-CM | POA: Diagnosis not present

## 2017-11-11 DIAGNOSIS — R972 Elevated prostate specific antigen [PSA]: Secondary | ICD-10-CM | POA: Diagnosis not present

## 2017-11-11 DIAGNOSIS — R351 Nocturia: Secondary | ICD-10-CM | POA: Diagnosis not present

## 2017-11-11 DIAGNOSIS — N401 Enlarged prostate with lower urinary tract symptoms: Secondary | ICD-10-CM | POA: Diagnosis not present

## 2017-11-11 DIAGNOSIS — R350 Frequency of micturition: Secondary | ICD-10-CM | POA: Diagnosis not present

## 2018-05-12 DIAGNOSIS — N401 Enlarged prostate with lower urinary tract symptoms: Secondary | ICD-10-CM | POA: Diagnosis not present

## 2018-05-12 DIAGNOSIS — R351 Nocturia: Secondary | ICD-10-CM | POA: Diagnosis not present

## 2018-08-08 DIAGNOSIS — E7849 Other hyperlipidemia: Secondary | ICD-10-CM | POA: Diagnosis not present

## 2018-08-08 DIAGNOSIS — N183 Chronic kidney disease, stage 3 (moderate): Secondary | ICD-10-CM | POA: Diagnosis not present

## 2018-08-08 DIAGNOSIS — R82998 Other abnormal findings in urine: Secondary | ICD-10-CM | POA: Diagnosis not present

## 2018-08-08 DIAGNOSIS — Z0000 Encounter for general adult medical examination without abnormal findings: Secondary | ICD-10-CM | POA: Diagnosis not present

## 2018-08-08 DIAGNOSIS — Z125 Encounter for screening for malignant neoplasm of prostate: Secondary | ICD-10-CM | POA: Diagnosis not present

## 2018-08-11 DIAGNOSIS — D72829 Elevated white blood cell count, unspecified: Secondary | ICD-10-CM | POA: Diagnosis not present

## 2018-08-14 DIAGNOSIS — D72829 Elevated white blood cell count, unspecified: Secondary | ICD-10-CM | POA: Diagnosis not present

## 2018-08-14 DIAGNOSIS — Z0000 Encounter for general adult medical examination without abnormal findings: Secondary | ICD-10-CM | POA: Diagnosis not present

## 2018-08-14 DIAGNOSIS — E7849 Other hyperlipidemia: Secondary | ICD-10-CM | POA: Diagnosis not present

## 2018-08-14 DIAGNOSIS — Z1331 Encounter for screening for depression: Secondary | ICD-10-CM | POA: Diagnosis not present

## 2018-08-14 DIAGNOSIS — K219 Gastro-esophageal reflux disease without esophagitis: Secondary | ICD-10-CM | POA: Diagnosis not present

## 2018-08-14 DIAGNOSIS — R7301 Impaired fasting glucose: Secondary | ICD-10-CM | POA: Diagnosis not present

## 2018-08-14 DIAGNOSIS — Z23 Encounter for immunization: Secondary | ICD-10-CM | POA: Diagnosis not present

## 2018-08-18 DIAGNOSIS — Z1212 Encounter for screening for malignant neoplasm of rectum: Secondary | ICD-10-CM | POA: Diagnosis not present

## 2018-08-25 ENCOUNTER — Encounter: Payer: Self-pay | Admitting: Adult Health

## 2018-08-25 ENCOUNTER — Telehealth: Payer: Self-pay | Admitting: Adult Health

## 2018-08-25 NOTE — Telephone Encounter (Signed)
A new hem appt has been scheduled for the pt to see Wilber Bihari on 3/2 at [facility]. Pt aware to arrive 30 minutes early. Letter mailed.

## 2018-09-12 ENCOUNTER — Ambulatory Visit (INDEPENDENT_AMBULATORY_CARE_PROVIDER_SITE_OTHER): Payer: Self-pay | Admitting: Physician Assistant

## 2018-09-15 ENCOUNTER — Inpatient Hospital Stay: Payer: BLUE CROSS/BLUE SHIELD | Attending: Adult Health | Admitting: Adult Health

## 2018-09-15 ENCOUNTER — Telehealth: Payer: Self-pay | Admitting: Hematology and Oncology

## 2018-09-15 ENCOUNTER — Other Ambulatory Visit: Payer: Self-pay

## 2018-09-15 ENCOUNTER — Inpatient Hospital Stay: Payer: BLUE CROSS/BLUE SHIELD

## 2018-09-15 VITALS — BP 138/91 | HR 57 | Temp 98.3°F | Resp 18 | Ht 71.0 in | Wt 221.3 lb

## 2018-09-15 DIAGNOSIS — D72810 Lymphocytopenia: Secondary | ICD-10-CM | POA: Insufficient documentation

## 2018-09-15 DIAGNOSIS — D72820 Lymphocytosis (symptomatic): Secondary | ICD-10-CM

## 2018-09-15 LAB — COMPREHENSIVE METABOLIC PANEL
ALK PHOS: 59 U/L (ref 38–126)
ALT: 21 U/L (ref 0–44)
AST: 22 U/L (ref 15–41)
Albumin: 4.2 g/dL (ref 3.5–5.0)
Anion gap: 6 (ref 5–15)
BUN: 24 mg/dL — ABNORMAL HIGH (ref 6–20)
CO2: 26 mmol/L (ref 22–32)
Calcium: 8.8 mg/dL — ABNORMAL LOW (ref 8.9–10.3)
Chloride: 110 mmol/L (ref 98–111)
Creatinine, Ser: 1.34 mg/dL — ABNORMAL HIGH (ref 0.61–1.24)
GFR calc Af Amer: >60 mL/min (ref >60)
GFR calc non Af Amer: 57 mL/min — ABNORMAL LOW (ref >60)
Glucose, Bld: 100 mg/dL — ABNORMAL HIGH (ref 70–99)
Potassium: 4.9 mmol/L (ref 3.5–5.1)
Sodium: 142 mmol/L (ref 135–145)
Total Bilirubin: 0.6 mg/dL (ref 0.3–1.2)
Total Protein: 6.7 g/dL (ref 6.5–8.1)

## 2018-09-15 LAB — CBC WITH DIFFERENTIAL (CANCER CENTER ONLY)
ABS IMMATURE GRANULOCYTES: 0 10*3/uL (ref 0.00–0.07)
Basophils Absolute: 0 10*3/uL (ref 0.0–0.1)
Basophils Relative: 0 %
Eosinophils Absolute: 0.4 10*3/uL (ref 0.0–0.5)
Eosinophils Relative: 2 %
HCT: 44.9 % (ref 39.0–52.0)
Hemoglobin: 14.9 g/dL (ref 13.0–17.0)
LYMPHS PCT: 84 %
Lymphs Abs: 15.5 10*3/uL — ABNORMAL HIGH (ref 0.7–4.0)
MCH: 29.9 pg (ref 26.0–34.0)
MCHC: 33.2 g/dL (ref 30.0–36.0)
MCV: 90.2 fL (ref 80.0–100.0)
Monocytes Absolute: 0.4 10*3/uL (ref 0.1–1.0)
Monocytes Relative: 2 %
NEUTROS ABS: 2.2 10*3/uL (ref 1.7–17.7)
Neutrophils Relative %: 12 %
Platelet Count: 142 10*3/uL — ABNORMAL LOW (ref 150–400)
RBC: 4.98 MIL/uL (ref 4.22–5.81)
RDW: 13 % (ref 11.5–15.5)
WBC Count: 18.4 10*3/uL — ABNORMAL HIGH (ref 4.0–10.5)
nRBC: 0 % (ref 0.0–0.2)

## 2018-09-15 LAB — LACTATE DEHYDROGENASE: LDH: 186 U/L (ref 98–192)

## 2018-09-15 NOTE — Telephone Encounter (Signed)
Patient decline avs and calendar °

## 2018-09-15 NOTE — Progress Notes (Addendum)
[place]  [phone] [phone]     ID: Walter Scott DOB: 05-15-1961  MR#: [id number]  CHE#:[id number]  Patient Care Team: Marton Redwood, MD as PCP - General (Internal Medicine) Franchot Gallo, MD as Consulting Physician (Urology) Scot Dock, NP OTHER MD:  CHIEF COMPLAINT: Lymphocytosis  CURRENT TREATMENT: Observation   HISTORY OF CURRENT ILLNESS:  [name] was found to have an elevated WBC and lymphocyte count at his last lab check with his PCP in late January, 2020.  He was referred to see [place] due to the elevated WBC and his mom's h/o CLL.    The patient's subsequent history is as detailed below.  INTERVAL HISTORY: Walter Scott is here today accompanied by his wife Barbaraann Share.  He is doing well today.  He and I reviewed his lab work from January and that his PCP is concerned he may have CLL.  We reviewed the presence of any symptoms such as fatigue, lymphadenopathy, fevers, night sweats or unintentional weight loss.  Truman Hayward notes some mild night sweats 3 times per week, but nothing else.  He notes no recent infectious processes as well.    REVIEW OF SYSTEMS:  Walter Scott denies any chest pain, palpitations, cough, shortness of breath, fevers chills.  He hasn't had any nausea, vomiting, bowel or bladder changes.  He is active and exercises 3-4 times per week.  A detailed ROS was otherwise non contributory.    PAST MEDICAL HISTORY: Past Medical History:  Diagnosis Date  . Alcoholism in remission [facility]) 2005   Fellowship [place]   . BPH with elevated PSA    Dr Diona Fanti  . Diverticulitis [place]  . Diverticulosis   . HLD (hyperlipidemia)   . Internal hemorrhoids   . Tubular adenoma of colon 12/2007    PAST SURGICAL HISTORY: Past Surgical History:  Procedure Laterality Date  . COLONOSCOPY  2010   Diverticulosis; [facility]  . HEMORRHOID SURGERY    . PROSTATE BIOPSY     07/2004, 2008; Dr Diona Fanti  . SHOULDER ARTHROSCOPY Left   .  WISDOM TOOTH EXTRACTION      FAMILY HISTORY Family History  Problem Relation Age of Onset  . Hypertension Mother   . Breast cancer Mother   . Heart attack Maternal Uncle        after 55  . Heart attack Paternal Grandfather        after 62  . Hypertension Sister   . Diabetes Maternal Uncle   . Stroke Neg Hx      SOCIAL HISTORY:  Married and lives with his wife, has 4 grown children and 3 grown step children.  He denies ETOH, tobacco, or drugs.      ADVANCED DIRECTIVES:    HEALTH MAINTENANCE: Social History   Tobacco Use  . Smoking status: Never Smoker  . Smokeless tobacco: Never Used  Substance Use Topics  . Alcohol use: No  . Drug use: Yes    Types: Marijuana    Comment: None since 1993     Colonoscopy: 09/2013 (every 5 years with Dr. Fuller Plan)  PSA: sees Dr. Diona Fanti every year.     No Known Allergies  Current Outpatient Medications  Medication Sig Dispense Refill  . finasteride (PROSCAR) 5 MG tablet TK 1 T PO QD    . fluticasone (FLONASE) 50 MCG/ACT nasal spray Place 1 spray into the nose 2 (two) times daily as needed for rhinitis. 16 g 2  . rosuvastatin (CRESTOR) 10 MG  tablet TK 1 T PO D HS     No current facility-administered medications for this visit.     OBJECTIVE:  Vitals:   09/15/18 0842  BP: (!) 138/91  Pulse: (!) 57  Resp: 18  Temp: 98.3 F (36.8 C)  SpO2: 95%     Body mass index is 30.87 kg/m.   Wt Readings from Last 3 Encounters:  09/15/18 221 lb 4.8 oz (100.4 kg)  10/14/17 230 lb (104.3 kg)  07/29/17 230 lb (104.3 kg)      ECOG FS:0 - Asymptomatic  GENERAL: Patient is a well appearing male in no acute distress HEENT:  Sclerae anicteric.  Oropharynx clear and moist. No ulcerations or evidence of oropharyngeal candidiasis. Neck is supple.  NODES:  No cervical, supraclavicular, or axillary lymphadenopathy palpated.  LUNGS:  Clear to auscultation bilaterally.  No wheezes or rhonchi. HEART:  Regular rate and rhythm. No murmur  appreciated. ABDOMEN:  Soft, nontender.  Positive, normoactive bowel sounds. No organomegaly palpated. MSK:  No focal spinal tenderness to palpation.  EXTREMITIES:  No peripheral edema.   SKIN:  Clear with no obvious rashes or skin changes. No nail dyscrasia. NEURO:  Nonfocal. Well oriented.  Appropriate affect.    LAB RESULTS:  CMP     Component Value Date/Time   NA 142 09/15/2018 1021   K 4.9 09/15/2018 1021   CL 110 09/15/2018 1021   CO2 26 09/15/2018 1021   GLUCOSE 100 (H) 09/15/2018 1021   BUN 24 (H) 09/15/2018 1021   CREATININE 1.34 (H) 09/15/2018 1021   CALCIUM 8.8 (L) 09/15/2018 1021   PROT 6.7 09/15/2018 1021   ALBUMIN 4.2 09/15/2018 1021   AST 22 09/15/2018 1021   ALT 21 09/15/2018 1021   ALKPHOS 59 09/15/2018 1021   BILITOT 0.6 09/15/2018 1021   GFRNONAA 57 (L) 09/15/2018 1021   GFRAA >60 09/15/2018 1021    No results found for: TOTALPROTELP, ALBUMINELP, A1GS, A2GS, BETS, BETA2SER, GAMS, MSPIKE, SPEI  No results found for: KPAFRELGTCHN, LAMBDASER, KAPLAMBRATIO  Lab Results  Component Value Date   WBC 18.4 (H) 09/15/2018   NEUTROABS 2.2 09/15/2018   HGB 14.9 09/15/2018   HCT 44.9 09/15/2018   MCV 90.2 09/15/2018   PLT 142 (L) 09/15/2018      Chemistry      Component Value Date/Time   NA 142 09/15/2018 1021   K 4.9 09/15/2018 1021   CL 110 09/15/2018 1021   CO2 26 09/15/2018 1021   BUN 24 (H) 09/15/2018 1021   CREATININE 1.34 (H) 09/15/2018 1021      Component Value Date/Time   CALCIUM 8.8 (L) 09/15/2018 1021   ALKPHOS 59 09/15/2018 1021   AST 22 09/15/2018 1021   ALT 21 09/15/2018 1021   BILITOT 0.6 09/15/2018 1021       No results found for: LABCA2  No components found for: NLGXQJ194  No results for input(s): INR in the last 168 hours.  No results found for: LABCA2  No results found for: RDE081  No results found for: KGY185  No results found for: UDJ497  No results found for: CA2729  No components found for: HGQUANT  No  results found for: CEA1 / No results found for: CEA1   No results found for: AFPTUMOR  No results found for: CHROMOGRNA  No results found for: PSA1  Appointment on 09/15/2018  Component Date Value Ref Range Status  . WBC Count 09/15/2018 18.4* 4.0 - 10.5 K/uL Final  . RBC 09/15/2018 4.98  4.22 - 5.81 MIL/uL Final  . Hemoglobin 09/15/2018 14.9  13.0 - 17.0 g/dL Final  . HCT 09/15/2018 44.9  39.0 - 52.0 % Final  . MCV 09/15/2018 90.2  80.0 - 100.0 fL Final  . MCH 09/15/2018 29.9  26.0 - 34.0 pg Final  . MCHC 09/15/2018 33.2  30.0 - 36.0 g/dL Final  . RDW 09/15/2018 13.0  11.5 - 15.5 % Final  . Platelet Count 09/15/2018 142* 150 - 400 K/uL Final  . nRBC 09/15/2018 0.0  0.0 - 0.2 % Final  . Neutrophils Relative % 09/15/2018 12  % Final  . Neutro Abs 09/15/2018 2.2  1.7 - 17.7 K/uL Final  . Lymphocytes Relative 09/15/2018 84  % Final  . Lymphs Abs 09/15/2018 15.5* 0.7 - 4.0 K/uL Final  . Monocytes Relative 09/15/2018 2  % Final  . Monocytes Absolute 09/15/2018 0.4  0.1 - 1.0 K/uL Final  . Eosinophils Relative 09/15/2018 2  % Final  . Eosinophils Absolute 09/15/2018 0.4  0.0 - 0.5 K/uL Final  . Basophils Relative 09/15/2018 0  % Final  . Basophils Absolute 09/15/2018 0.0  0.0 - 0.1 K/uL Final  . WBC Morphology 09/15/2018 VARIANT LYMPHS   Final  . Abs Immature Granulocytes 09/15/2018 0.00  0.00 - 0.07 K/uL Final  . Smudge Cells 09/15/2018 PRESENT   Final   Performed at [facility], [place] [address] [place], [place] [address]  . LDH 09/15/2018 186  98 - 192 U/L Final   Performed at [facility], [place] [address], [place], [place] [address]  . Sodium 09/15/2018 142  135 - 145 mmol/L Final  . Potassium 09/15/2018 4.9  3.5 - 5.1 mmol/L Final  . Chloride 09/15/2018 110  98 - 111 mmol/L Final  . CO2 09/15/2018 26  22 - 32 mmol/L Final  . Glucose, Bld 09/15/2018 100* 70 - 99 mg/dL Final  . BUN 09/15/2018 24* 6 - 20 mg/dL Final  . Creatinine,  Ser 09/15/2018 1.34* 0.61 - 1.24 mg/dL Final  . Calcium 09/15/2018 8.8* 8.9 - 10.3 mg/dL Final  . Total Protein 09/15/2018 6.7  6.5 - 8.1 g/dL Final  . Albumin 09/15/2018 4.2  3.5 - 5.0 g/dL Final  . AST 09/15/2018 22  15 - 41 U/L Final  . ALT 09/15/2018 21  0 - 44 U/L Final  . Alkaline Phosphatase 09/15/2018 59  38 - 126 U/L Final  . Total Bilirubin 09/15/2018 0.6  0.3 - 1.2 mg/dL Final  . GFR calc non Af Amer 09/15/2018 57* >60 mL/min Final  . GFR calc Af Amer 09/15/2018 >60  >60 mL/min Final  . Anion gap 09/15/2018 6  5 - 15 Final   Performed at [facility], [place] [address], [place], [place] [address]    (this displays the last labs from the last 3 days)  No results found for: TOTALPROTELP, ALBUMINELP, A1GS, A2GS, BETS, BETA2SER, GAMS, MSPIKE, SPEI (this displays SPEP labs)  No results found for: KPAFRELGTCHN, LAMBDASER, KAPLAMBRATIO (kappa/lambda light chains)  No results found for: HGBA, HGBA2QUANT, HGBFQUANT, HGBSQUAN (Hemoglobinopathy evaluation)   Lab Results  Component Value Date   LDH 186 09/15/2018    No results found for: IRON, TIBC, IRONPCTSAT (Iron and TIBC)  No results found for: FERRITIN  Urinalysis No results found for: COLORURINE, APPEARANCEUR, LABSPEC, PHURINE, GLUCOSEU, HGBUR, BILIRUBINUR, KETONESUR, PROTEINUR, UROBILINOGEN, NITRITE, LEUKOCYTESUR   STUDIES: No results found.    ASSESSMENT: 63 y.o. with lymphocytosis noted on labs  with PCP on 08/08/2018 and concern for CLL.  1. Peripheral blood flow cytometry pending  2. FISH-CLL prognostic panel pending  3. LDH  PLAN:  Eros is doing well today.  He met with Dr. Lindi Adie to review his labs.  Dr. Lindi Adie reviewed with Truman Hayward and his wife Barbaraann Share in detail the causes of lymphocytosis and the plan for him.  Today, we will add on some labs including: CBC, CMET, FISH-CLL prognostic panel, flow cytometry, and LDH.    Dr. Lindi Adie reviewed with Truman Hayward and Barbaraann Share the stages of CLL, and the  likelihood that Maximilliano has stage 0 CLL.  Dr. Lindi Adie reviewed with Truman Hayward and Barbaraann Share reasons to treat CLL which include: symptoms and/ or short lymphocyte doubling time.    Dr. Lindi Adie will meet with Truman Hayward again in 3 months.  At this time, we will await the above studies and call him with results.    Scot Dock, NP   09/15/2018 11:42 AM Medical Oncology and Hematology [facility] [address] [place], [place] [address] Tel. [phone]    Fax. [phone]  Attending Note  I personally saw and examined Walter Scott. The plan of care was discussed with him. I agree with the assessment and plan as documented above. Lymphocytosis 14,000: Highly suspicious for CLL. We would like to get flow cytometry and FISH panel for cytogenetics to determine prognosis of CLL. There is no indication for CT scanning.  This is based upon choosing wisely guidelines. I would like to follow him in 3 months with labs. Signed Harriette Ohara, MD

## 2018-09-17 LAB — FLOW CYTOMETRY

## 2018-09-26 ENCOUNTER — Telehealth: Payer: Self-pay | Admitting: Adult Health

## 2018-09-26 NOTE — Telephone Encounter (Signed)
Called again to discuss results and LMOM to call us back on Monday, 09/29/2018 at [phone].  Wilber Bihari, NP

## 2018-09-26 NOTE — Telephone Encounter (Signed)
Called patient to review lab results.  He did not pick up.  Did not receive voice mail either.  Will try again later.  Wilber Bihari, NP

## 2018-09-29 ENCOUNTER — Telehealth: Payer: Self-pay | Admitting: Adult Health

## 2018-09-29 LAB — FISH,CLL PROGNOSTIC PANEL

## 2018-09-29 NOTE — Telephone Encounter (Signed)
Called patient and reviewed his blood work with him, that confirm CLL, but deletion 13, which is a good prognostic cytogenetic result.  Reviewed his plan, which is to continue to monitor his labs, the absence/presence of lymph nodes, and symptom development.  He is doing well.  Verbalized understanding and thanked me for call.  Wilber Bihari, NP

## 2018-09-30 ENCOUNTER — Encounter: Payer: Self-pay | Admitting: Physician Assistant

## 2018-09-30 ENCOUNTER — Other Ambulatory Visit (INDEPENDENT_AMBULATORY_CARE_PROVIDER_SITE_OTHER): Payer: Self-pay | Admitting: Orthopedic Surgery

## 2018-09-30 DIAGNOSIS — M75111 Incomplete rotator cuff tear or rupture of right shoulder, not specified as traumatic: Secondary | ICD-10-CM | POA: Diagnosis not present

## 2018-09-30 DIAGNOSIS — G8918 Other acute postprocedural pain: Secondary | ICD-10-CM | POA: Diagnosis not present

## 2018-09-30 DIAGNOSIS — M7551 Bursitis of right shoulder: Secondary | ICD-10-CM | POA: Diagnosis not present

## 2018-09-30 DIAGNOSIS — M659 Synovitis and tenosynovitis, unspecified: Secondary | ICD-10-CM | POA: Diagnosis not present

## 2018-09-30 DIAGNOSIS — M7541 Impingement syndrome of right shoulder: Secondary | ICD-10-CM | POA: Diagnosis not present

## 2018-09-30 DIAGNOSIS — M24111 Other articular cartilage disorders, right shoulder: Secondary | ICD-10-CM | POA: Diagnosis not present

## 2018-09-30 MED ORDER — OXYCODONE-ACETAMINOPHEN 5-325 MG PO TABS: 1.0000 | ORAL_TABLET | ORAL | 0 refills | Status: DC | PRN

## 2018-10-03 ENCOUNTER — Encounter: Payer: Self-pay | Admitting: Gastroenterology

## 2018-10-09 ENCOUNTER — Ambulatory Visit (INDEPENDENT_AMBULATORY_CARE_PROVIDER_SITE_OTHER): Payer: BLUE CROSS/BLUE SHIELD | Admitting: Physician Assistant

## 2018-10-09 ENCOUNTER — Other Ambulatory Visit: Payer: Self-pay

## 2018-10-09 ENCOUNTER — Encounter (INDEPENDENT_AMBULATORY_CARE_PROVIDER_SITE_OTHER): Payer: Self-pay | Admitting: Orthopedic Surgery

## 2018-10-09 ENCOUNTER — Telehealth: Payer: Self-pay | Admitting: *Deleted

## 2018-10-09 VITALS — Ht 71.0 in | Wt 221.0 lb

## 2018-10-09 DIAGNOSIS — Z9889 Other specified postprocedural states: Secondary | ICD-10-CM

## 2018-10-09 NOTE — Progress Notes (Signed)
Office Visit Note   Patient: Walter Scott           Date of Birth: [birth year]           MRN: [id number] Visit Date: 10/09/2018              Requested by: Marton Redwood, MD [address] [place], [place] [address] PCP: Marton Redwood, MD  Chief Complaint  Patient presents with  . Right Shoulder - Routine Post Op    09/30/18 right shoulder scope, deb and SAD      HPI: The patient is a 63 year old gentleman who is seen for postoperative follow-up following a right shoulder arthroscopic debridement on 09/30/2018.  He had a block and reports that he had no significant pain postoperatively and he was very pleased with the results of the block.  He reports that he started his pendulum exercises and is doing very well with this as well as while walking.  He reports some mild discomfort with activity but overall he is very pleased with his progress.  Assessment & Plan: Visit Diagnoses:  1. Status post arthroscopy of right shoulder     Plan: Sutures were removed.  He was given a home exercise program for shoulder pendulum exercises and then progress to rotator cuff strengthening exercises to tolerance. He will call back in several weeks to let us know how he is doing and make sure he is progressing appropriately. If he is slow with his progress, he may need a referral for Physical therapy.   Follow-Up Instructions: Return if symptoms worsen or fail to improve.   Ortho Exam  Patient is alert, oriented, no adenopathy, well-dressed, normal affect, normal respiratory effort. Sutures were removed this visit.  The port sites are healing well.  There are no signs of infection or cellulitis.  He has some mild discomfort with forward flexion internal and external rotation but his motion is coming along nicely.  He is doing well with pendulum exercises.  He is neurovascularly intact distally.  Imaging: No results found. No images are attached to the encounter.  Labs: No results found for:  HGBA1C, ESRSEDRATE, CRP, LABURIC, REPTSTATUS, GRAMSTAIN, CULT, LABORGA   Lab Results  Component Value Date   ALBUMIN 4.2 09/15/2018   ALBUMIN 4.2 08/22/2012   ALBUMIN 4.3 08/22/2011    Body mass index is 30.82 kg/m.  Orders:  No orders of the defined types were placed in this encounter.  No orders of the defined types were placed in this encounter.    Procedures: No procedures performed  Clinical Data: No additional findings.  ROS:  All other systems negative, except as noted in the HPI. Review of Systems  Objective: Vital Signs: Ht 5\' 11"  (1.803 m)   Wt 221 lb (100.2 kg)   BMI 30.82 kg/m   Specialty Comments:  No specialty comments available.  PMFS History: Patient Active Problem List   Diagnosis Date Noted  . Impingement syndrome of right shoulder 07/29/2017  . Sinusitis 10/20/2012  . ADD 03/27/2010  . GERD 03/27/2010  . BENIGN PROSTATIC HYPERTROPHY 03/27/2010  . DIVERTICULITIS, HX OF 03/27/2010   Past Medical History:  Diagnosis Date  . Alcoholism in remission [facility]) 2005   Fellowship [place]   . BPH with elevated PSA    Dr Diona Fanti  . Diverticulitis [place]  . Diverticulosis   . HLD (hyperlipidemia)   . Internal hemorrhoids   . Tubular adenoma of colon 12/2007    Family History  Problem Relation Age of Onset  . Hypertension Mother   . Breast cancer Mother   . Heart attack Maternal Uncle        after 55  . Heart attack Paternal Grandfather        after 41  . Hypertension Sister   . Diabetes Maternal Uncle   . Stroke Neg Hx     Past Surgical History:  Procedure Laterality Date  . COLONOSCOPY  2010   Diverticulosis; [facility]  . HEMORRHOID SURGERY    . PROSTATE BIOPSY     07/2004, 2008; Dr Diona Fanti  . SHOULDER ARTHROSCOPY Left   . WISDOM TOOTH EXTRACTION     Social History   Occupational History  . Occupation: minister  Tobacco Use  . Smoking status: Never Smoker  . Smokeless tobacco: Never Used  Substance and  Sexual Activity  . Alcohol use: No  . Drug use: Yes    Types: Marijuana    Comment: None since 1993  . Sexual activity: Not on file

## 2018-10-09 NOTE — Telephone Encounter (Signed)
Received a voice mail from Revie calling from [facility] regarding Mr. Walter Scott.  Stating they needed a diagnosis code and to reach them at [phone] and client ID [phone].  Information given to Lorie, RN to follow up on.

## 2018-11-06 ENCOUNTER — Telehealth: Payer: Self-pay | Admitting: *Deleted

## 2018-11-06 NOTE — Telephone Encounter (Signed)
Received VM from [facility] requesting diagnosis code for pt.  [phone].  Information given to Wilber Bihari, NP

## 2018-12-09 ENCOUNTER — Telehealth: Payer: Self-pay | Admitting: Hematology and Oncology

## 2018-12-09 DIAGNOSIS — C9110 Chronic lymphocytic leukemia of B-cell type not having achieved remission: Secondary | ICD-10-CM | POA: Insufficient documentation

## 2018-12-09 NOTE — Telephone Encounter (Signed)
Called patient per 5/26 sch message - no answer . Left message for patient to call back for reschedule.

## 2018-12-09 NOTE — Assessment & Plan Note (Deleted)
09/15/2018: Flow cytometry: Monoclonal B-cell population CD5 and CD23 positive CLL FISH panel: By allelic deletion of 13 q., favorable prognostic marker.  No evidence of 17 P deletion or trisomy 12 Lab review: 09/15/2018: WBC 18.4, ALC 15.5, platelets 142, hemoglobin 14.9, LDH 186  I discussed with the patient appears favorable prognostic findings from the [facility] panel.  We will continue to watch and monitor with blood work periodically.  Return to clinic in 6 months with labs and follow-up as a virtual visit.  Labs to be done ahead of time.

## 2018-12-12 ENCOUNTER — Telehealth: Payer: Self-pay | Admitting: Hematology and Oncology

## 2018-12-12 NOTE — Telephone Encounter (Signed)
Erroneous encounter

## 2018-12-12 NOTE — Telephone Encounter (Signed)
Per 5/27 schedule message moved 6/2 lab/fu to 6/22. Left message for patient. Schedule mailed.

## 2018-12-16 ENCOUNTER — Ambulatory Visit: Payer: BLUE CROSS/BLUE SHIELD | Admitting: Hematology and Oncology

## 2018-12-16 ENCOUNTER — Other Ambulatory Visit: Payer: BLUE CROSS/BLUE SHIELD

## 2018-12-30 NOTE — Assessment & Plan Note (Signed)
Flow Cytometry: 09/15/18: monoclonal B-cell population with expression of CD5 and CD23. Cytogenetics: Bi-allelic deletion of 53G99, No 17p del, No trisomy 12 Labs: 09/19/18: WBC: 18.4, Hb 14.9, Pl 142, ALC: 15.5  RTC every 3 months with labs and follow up.

## 2019-01-03 NOTE — Progress Notes (Signed)
Patient Care Team: Marton Redwood, MD as PCP - General (Internal Medicine) Franchot Gallo, MD as Consulting Physician (Urology)  DIAGNOSIS:    ICD-10-CM   1. Chronic lymphocytic leukemia (HCC)  C91.10     CHIEF COMPLIANT: Follow-up of CLL  INTERVAL HISTORY: Walter Scott is a 63 y.o. with above-mentioned history of CLL. Peripheral blood flow cytometry on 09/15/18 confirmed the presence of monoclonal B cells with expression of CD5 and CD23, consistent with chronic lymphocytic leukemia. FISH testing detected a bi-allelic deletion of the [id number] locus. WBC was 18.4, LDH 186. He presents to the clinic today for 3 month follow-up to discuss these results and for continued surveillance.   REVIEW OF SYSTEMS:   Constitutional: Denies fevers, chills or abnormal weight loss Eyes: Denies blurriness of vision Ears, nose, mouth, throat, and face: Denies mucositis or sore throat Respiratory: Denies cough, dyspnea or wheezes Cardiovascular: Denies palpitation, chest discomfort Gastrointestinal: Denies nausea, heartburn or change in bowel habits Skin: Denies abnormal skin rashes Lymphatics: Denies new lymphadenopathy or easy bruising Neurological: Denies numbness, tingling or new weaknesses Behavioral/Psych: Mood is stable, no new changes  Extremities: No lower extremity edema All other systems were reviewed with the patient and are negative.  I have reviewed the past medical history, past surgical history, social history and family history with the patient and they are unchanged from previous note.  ALLERGIES:  has No Known Allergies.  MEDICATIONS:  Current Outpatient Medications  Medication Sig Dispense Refill  . finasteride (PROSCAR) 5 MG tablet TK 1 T PO QD    . fluticasone (FLONASE) 50 MCG/ACT nasal spray Place 1 spray into the nose 2 (two) times daily as needed for rhinitis. 16 g 2  . oxyCODONE-acetaminophen (PERCOCET/ROXICET) 5-325 MG tablet Take 1-2 tablets by mouth every 4  (four) hours as needed. 30 tablet 0  . rosuvastatin (CRESTOR) 10 MG tablet TK 1 T PO D HS     No current facility-administered medications for this visit.     PHYSICAL EXAMINATION: ECOG PERFORMANCE STATUS: 1 - Symptomatic but completely ambulatory  Vitals:   01/05/19 1000  BP: 133/84  Pulse: (!) 53  Resp: 20  Temp: (!) 95 F (35 C)  SpO2: 95%   Filed Weights   01/05/19 1000  Weight: 223 lb 3.2 oz (101.2 kg)    GENERAL: alert, no distress and comfortable SKIN: skin color, texture, turgor are normal, no rashes or significant lesions EYES: normal, Conjunctiva are pink and non-injected, sclera clear OROPHARYNX: no exudate, no erythema and lips, buccal mucosa, and tongue normal  NECK: supple, thyroid normal size, non-tender, without nodularity LYMPH: no palpable lymphadenopathy in the cervical, axillary or inguinal LUNGS: clear to auscultation and percussion with normal breathing effort HEART: regular rate & rhythm and no murmurs and no lower extremity edema ABDOMEN: abdomen soft, non-tender and normal bowel sounds MUSCULOSKELETAL: no cyanosis of digits and no clubbing  NEURO: alert & oriented x 3 with fluent speech, no focal motor/sensory deficits EXTREMITIES: No lower extremity edema  LABORATORY DATA:  I have reviewed the data as listed CMP Latest Ref Rng & Units 09/15/2018 08/22/2012 08/22/2011  Glucose 70 - 99 mg/dL 100(H) 87 85  BUN 6 - 20 mg/dL 24(H) 25(H) 24(H)  Creatinine 0.61 - 1.24 mg/dL 1.34(H) 1.3 1.2  Sodium 135 - 145 mmol/L 142 137 140  Potassium 3.5 - 5.1 mmol/L 4.9 3.8 4.5  Chloride 98 - 111 mmol/L 110 105 106  CO2 22 - 32 mmol/L 26 25 27  Calcium 8.9 - 10.3 mg/dL 8.8(L) 9.1 9.5  Total Protein 6.5 - 8.1 g/dL 6.7 7.1 7.1  Total Bilirubin 0.3 - 1.2 mg/dL 0.6 0.6 0.8  Alkaline Phos 38 - 126 U/L 59 55 57  AST 15 - 41 U/L 22 35 33  ALT 0 - 44 U/L 21 48 55(H)    Lab Results  Component Value Date   WBC 17.9 (H) 01/05/2019   HGB 14.7 01/05/2019   HCT 43.0  01/05/2019   MCV 89.8 01/05/2019   PLT 128 (L) 01/05/2019   NEUTROABS 2.5 01/05/2019    ASSESSMENT & PLAN:  Chronic lymphocytic leukemia (HCC) Flow Cytometry: 09/15/18: monoclonal B-cell population with expression of CD5 and CD23. Cytogenetics: Bi-allelic deletion of 90W40, No 17p del, No trisomy 12 Labs: 09/19/18: WBC: 18.4, Hb 14.9, Pl 142, ALC: 15.5 01/05/2019: WBC 17.9, hemoglobin 14.7, platelets 128, ALC 13.3  RTC in 4 months with labs and follow up. I discussed with him that the platelet count of 128 is not a concern.  If his platelets drop below 50 then we have to worry about risk of bleeding.  We also discussed indications for treatment of CLL which include B symptoms like fevers chills night sweats and weight loss, rapid doubling time of 3 months with a lymphocyte count of rapidly enlarging lymph nodes.  He has none of them and therefore we can watch and monitor for now.    No orders of the defined types were placed in this encounter.  The patient has a good understanding of the overall plan. he agrees with it. he will call with any problems that may develop before the next visit here.  Nicholas Lose, MD 01/05/2019  Julious Oka Dorshimer am acting as scribe for Dr. Nicholas Lose.  I have reviewed the above documentation for accuracy and completeness, and I agree with the above.

## 2019-01-05 ENCOUNTER — Other Ambulatory Visit: Payer: Self-pay

## 2019-01-05 ENCOUNTER — Encounter: Payer: Self-pay | Admitting: Gastroenterology

## 2019-01-05 ENCOUNTER — Inpatient Hospital Stay: Payer: 59

## 2019-01-05 ENCOUNTER — Inpatient Hospital Stay: Payer: 59 | Attending: Adult Health | Admitting: Hematology and Oncology

## 2019-01-05 DIAGNOSIS — C9110 Chronic lymphocytic leukemia of B-cell type not having achieved remission: Secondary | ICD-10-CM | POA: Insufficient documentation

## 2019-01-05 DIAGNOSIS — D72820 Lymphocytosis (symptomatic): Secondary | ICD-10-CM

## 2019-01-05 LAB — CBC WITH DIFFERENTIAL (CANCER CENTER ONLY)
Abs Immature Granulocytes: 0.02 10*3/uL (ref 0.00–0.07)
Basophils Absolute: 0.1 10*3/uL (ref 0.0–0.1)
Basophils Relative: 0 %
Eosinophils Absolute: 0.2 10*3/uL (ref 0.0–0.5)
Eosinophils Relative: 1 %
HCT: 43 % (ref 39.0–52.0)
Hemoglobin: 14.7 g/dL (ref 13.0–17.0)
Immature Granulocytes: 0 %
Lymphocytes Relative: 74 %
Lymphs Abs: 13.3 10*3/uL — ABNORMAL HIGH (ref 0.7–4.0)
MCH: 30.7 pg (ref 26.0–34.0)
MCHC: 34.2 g/dL (ref 30.0–36.0)
MCV: 89.8 fL (ref 80.0–100.0)
Monocytes Absolute: 1.9 10*3/uL — ABNORMAL HIGH (ref 0.1–1.0)
Monocytes Relative: 11 %
Neutro Abs: 2.5 10*3/uL (ref 1.7–7.7)
Neutrophils Relative %: 14 %
Platelet Count: 128 10*3/uL — ABNORMAL LOW (ref 150–400)
RBC: 4.79 MIL/uL (ref 4.22–5.81)
RDW: 13.3 % (ref 11.5–15.5)
WBC Count: 17.9 10*3/uL — ABNORMAL HIGH (ref 4.0–10.5)
nRBC: 0 % (ref 0.0–0.2)

## 2019-01-05 LAB — LACTATE DEHYDROGENASE: LDH: 195 U/L — ABNORMAL HIGH (ref 98–192)

## 2019-01-08 ENCOUNTER — Telehealth: Payer: Self-pay | Admitting: Hematology and Oncology

## 2019-01-08 NOTE — Telephone Encounter (Signed)
I talk with patient regarding schedule  

## 2019-01-30 ENCOUNTER — Ambulatory Visit: Payer: 59 | Admitting: *Deleted

## 2019-01-30 ENCOUNTER — Other Ambulatory Visit: Payer: Self-pay

## 2019-01-30 VITALS — Ht 72.0 in | Wt 218.0 lb

## 2019-01-30 DIAGNOSIS — Z86010 Personal history of colonic polyps: Secondary | ICD-10-CM

## 2019-01-30 MED ORDER — SUPREP BOWEL PREP KIT 17.5-3.13-1.6 GM/177ML PO SOLN: 1.0000 | Freq: Once | ORAL | 0 refills | Status: AC

## 2019-01-30 NOTE — Progress Notes (Signed)
No egg or soy allergy known to patient  No issues with past sedation with any surgeries  or procedures, no intubation problems  No diet pills per patient No home 02 use per patient  No blood thinners per patient  Pt denies issues with constipation  No A fib or A flutter  EMMI video sent to pt's e mail  Pt is traveling to [place] today- instructed to call if he develops any COVID s/s after he travels- he verbalized understanding   Pt verified name, DOB, address and insurance during PV today. Pt mailed instruction packet to included paper to complete and mail back to [facility] with addressed and stamped envelope, Emmi video, copy of consent form to read and not return, and instructions. Suprep $15  coupon mailed in packet. PV completed over the phone. Pt encouraged to call with questions or issues   Pt is aware that care partner will wait in the car during procedure; if they feel like they will be too hot to wait in the car; they may wait in the lobby.  We want them to wear a mask (we do not have any that we can provide them), practice social distancing, and we will check their temperatures when they get here.  I did remind patient that their care partner needs to stay in the parking lot the entire time. Pt will wear mask into building.

## 2019-02-12 ENCOUNTER — Telehealth: Payer: Self-pay | Admitting: Gastroenterology

## 2019-02-12 NOTE — Telephone Encounter (Signed)

## 2019-02-13 ENCOUNTER — Other Ambulatory Visit: Payer: Self-pay

## 2019-02-13 ENCOUNTER — Encounter: Payer: Self-pay | Admitting: Gastroenterology

## 2019-02-13 ENCOUNTER — Ambulatory Visit (AMBULATORY_SURGERY_CENTER): Payer: 59 | Admitting: Gastroenterology

## 2019-02-13 VITALS — BP 132/86 | HR 56 | Temp 97.7°F | Resp 19 | Ht 72.0 in | Wt 218.0 lb

## 2019-02-13 DIAGNOSIS — D120 Benign neoplasm of cecum: Secondary | ICD-10-CM

## 2019-02-13 DIAGNOSIS — D123 Benign neoplasm of transverse colon: Secondary | ICD-10-CM

## 2019-02-13 DIAGNOSIS — Z86010 Personal history of colonic polyps: Secondary | ICD-10-CM | POA: Diagnosis present

## 2019-02-13 MED ORDER — SODIUM CHLORIDE 0.9 % IV SOLN: 500.0000 mL | Freq: Once | INTRAVENOUS | Status: DC

## 2019-02-13 NOTE — Progress Notes (Signed)
Called to room to assist during endoscopic procedure.  Patient ID and intended procedure confirmed with present staff. Received instructions for my participation in the procedure from the performing physician.  

## 2019-02-13 NOTE — Patient Instructions (Signed)
Handouts given for polyps, diverticulosis, hemorrhoids and high fiber diet.  YOU HAD AN ENDOSCOPIC PROCEDURE TODAY AT [facility]:   Refer to the procedure report that was given to you for any specific questions about what was found during the examination.  If the procedure report does not answer your questions, please call your gastroenterologist to clarify.  If you requested that your care partner not be given the details of your procedure findings, then the procedure report has been included in a sealed envelope for you to review at your convenience later.  YOU SHOULD EXPECT: Some feelings of bloating in the abdomen. Passage of more gas than usual.  Walking can help get rid of the air that was put into your GI tract during the procedure and reduce the bloating. If you had a lower endoscopy (such as a colonoscopy or flexible sigmoidoscopy) you may notice spotting of blood in your stool or on the toilet paper. If you underwent a bowel prep for your procedure, you may not have a normal bowel movement for a few days.  Please Note:  You might notice some irritation and congestion in your nose or some drainage.  This is from the oxygen used during your procedure.  There is no need for concern and it should clear up in a day or so.  SYMPTOMS TO REPORT IMMEDIATELY:   Following lower endoscopy (colonoscopy or flexible sigmoidoscopy):  Excessive amounts of blood in the stool  Significant tenderness or worsening of abdominal pains  Swelling of the abdomen that is new, acute  Fever of 100F or higher  For urgent or emergent issues, a gastroenterologist can be reached at any hour by calling [phone].   DIET:  We do recommend a small meal at first, but then you may proceed to your regular diet.  Drink plenty of fluids but you should avoid alcoholic beverages for 24 hours.  ACTIVITY:  You should plan to take it easy for the rest of today and you should NOT DRIVE or use heavy  machinery until tomorrow (because of the sedation medicines used during the test).    FOLLOW UP: Our staff will call the number listed on your records 48-72 hours following your procedure to check on you and address any questions or concerns that you may have regarding the information given to you following your procedure. If we do not reach you, we will leave a message.  We will attempt to reach you two times.  During this call, we will ask if you have developed any symptoms of COVID 19. If you develop any symptoms (ie: fever, flu-like symptoms, shortness of breath, cough etc.) before then, please call [phone].  If you test positive for Covid 19 in the 2 weeks post procedure, please call and report this information to [place].    If any biopsies were taken you will be contacted by phone or by letter within the next 1-3 weeks.  Please call us at [phone] if you have not heard about the biopsies in 3 weeks.    SIGNATURES/CONFIDENTIALITY: You and/or your care partner have signed paperwork which will be entered into your electronic medical record.  These signatures attest to the fact that that the information above on your After Visit Summary has been reviewed and is understood.  Full responsibility of the confidentiality of this discharge information lies with you and/or your care-partner.

## 2019-02-13 NOTE — Progress Notes (Signed)
Report to PACU, RN, vss, BBS= Clear.  

## 2019-02-13 NOTE — Op Note (Signed)
[facility] Patient Name: Walter Scott Procedure Date: 02/13/2019 8:17 AM MRN: [id number] Endoscopist: Ladene Artist , MD Age: 63 Referring MD:  Date of Birth: September 15, 1955 Gender: Male Account #: [id number] Procedure:                Colonoscopy Indications:              Surveillance: Personal history of adenomatous                            polyps on last colonoscopy 5 years ago Medicines:                Monitored Anesthesia Care Procedure:                Pre-Anesthesia Assessment:                           - Prior to the procedure, a History and Physical                            was performed, and patient medications and                            allergies were reviewed. The patient's tolerance of                            previous anesthesia was also reviewed. The risks                            and benefits of the procedure and the sedation                            options and risks were discussed with the patient.                            All questions were answered, and informed consent                            was obtained. Prior Anticoagulants: The patient has                            taken no previous anticoagulant or antiplatelet                            agents. ASA Grade Assessment: II - A patient with                            mild systemic disease. After reviewing the risks                            and benefits, the patient was deemed in                            satisfactory condition to undergo the procedure.  After obtaining informed consent, the colonoscope                            was passed under direct vision. Throughout the                            procedure, the patient's blood pressure, pulse, and                            oxygen saturations were monitored continuously. The                            Colonoscope was introduced through the anus and                            advanced to the the cecum,  identified by                            appendiceal orifice and ileocecal valve. The                            ileocecal valve, appendiceal orifice, and rectum                            were photographed. The quality of the bowel                            preparation was good after extensive lavage and                            suctioning. The colonoscopy was performed without                            difficulty. The patient tolerated the procedure                            well. Scope In: 8:24:10 AM Scope Out: 8:44:25 AM Scope Withdrawal Time: 0 hours 17 minutes 17 seconds  Total Procedure Duration: 0 hours 20 minutes 15 seconds  Findings:                 The perianal and digital rectal examinations were                            normal.                           Two sessile polyps were found in the transverse                            colon and cecum. The polyps were 5 to 6 mm in size.                            These polyps were removed with a cold snare.  Resection and retrieval were complete.                           Scattered medium-mouthed diverticula were found in                            the right colon. There was no evidence of                            diverticular bleeding.                           Multiple medium-mouthed diverticula were found in                            the left colon. There was evidence of diverticular                            spasm. There was evidence of an impacted                            diverticulum. There was no evidence of diverticular                            bleeding.                           Internal hemorrhoids were found during                            retroflexion. The hemorrhoids were small and Grade                            I (internal hemorrhoids that do not prolapse).                           The exam was otherwise without abnormality on                            direct and  retroflexion views. Complications:            No immediate complications. Estimated blood loss:                            None. Estimated Blood Loss:     Estimated blood loss: none. Impression:               - Two 5 to 6 mm polyps in the transverse colon and                            in the cecum, removed with a cold snare. Resected                            and retrieved.                           -  Mild diverticulosis in the right colon.                           - Moderate diverticulosis in the left colon.                           - Internal hemorrhoids.                           - The examination was otherwise normal on direct                            and retroflexion views. Recommendation:           - Repeat colonoscopy in 7-10 years for                            surveillance, pending pathology review.                           - Patient has a contact number available for                            emergencies. The signs and symptoms of potential                            delayed complications were discussed with the                            patient. Return to normal activities tomorrow.                            Written discharge instructions were provided to the                            patient.                           - High fiber diet long term.                           - Continue present medications.                           - Await pathology results. Ladene Artist, MD 02/13/2019 8:50:31 AM This report has been signed electronically.

## 2019-02-13 NOTE — Progress Notes (Signed)
Pt's states no medical or surgical changes since previsit or office visit.  Temp taken by JB VS taken by CW 

## 2019-02-17 ENCOUNTER — Telehealth: Payer: Self-pay | Admitting: *Deleted

## 2019-02-17 ENCOUNTER — Telehealth: Payer: Self-pay

## 2019-02-17 NOTE — Telephone Encounter (Signed)
  Follow up Call-  Call back number 02/13/2019  Post procedure Call Back phone  # [phone]  Permission to leave phone message Yes  Some recent data might be hidden     Patient questions:  Left message to call us if necessary.

## 2019-02-17 NOTE — Telephone Encounter (Signed)
No answer, left message to call if having any issues or concerns, B.Akeema Broder RN 

## 2019-02-20 ENCOUNTER — Encounter: Payer: Self-pay | Admitting: Gastroenterology

## 2019-04-01 IMAGING — MR MR SHOULDER*R* W/O CM
4 of 5 series · 14 of 40 positions shown · non-contrast
Comparison: 07/29/2017

CLINICAL DATA: Increasing right shoulder pain. Arthroscopic
debridement years ago.

EXAM:
MRI OF THE RIGHT SHOULDER WITHOUT CONTRAST
TECHNIQUE: Multiplanar, multisequence MR imaging of the shoulder was performed.
No intravenous contrast was administered.

[Series 6: T2 fat-sat · axial · right · 3.0mm · 0.44mm/px · z∈[-97,-7]mm · 3 of 32 slices shown (1 of 3)]
[im 4/32]
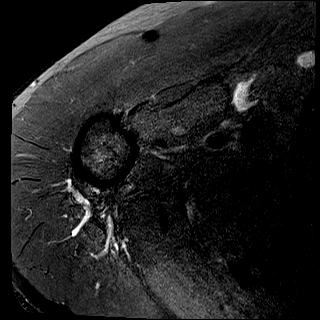
[im 18/32]
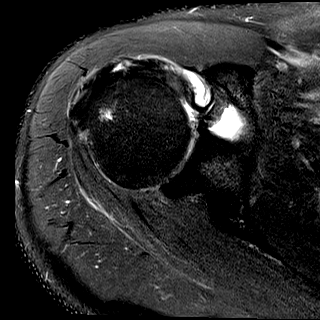
[im 28/32]
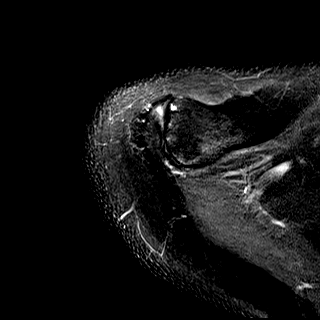

[Series 7: T2 fat-sat · oblique · right · 3.0mm · 0.44mm/px · 3 of 25 slices shown (2 of 3)]
[im 5/25]
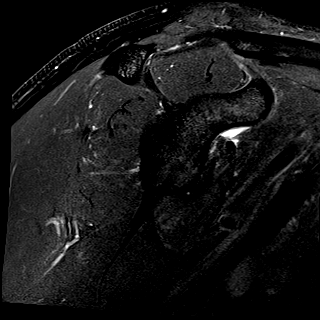
[im 13/25]
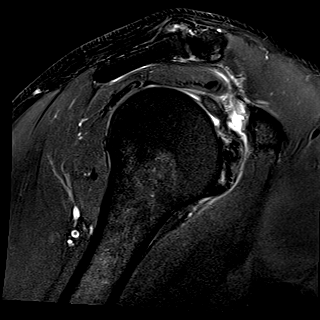
[im 21/25]
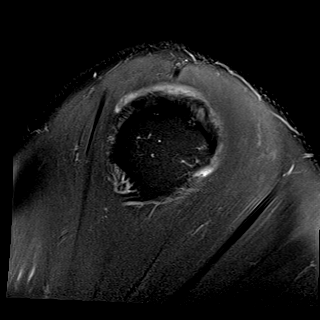

[Series 9: PD · oblique · right · 3.0mm · 0.18mm/px · 5 of 28 slices shown]
[im 1/28]
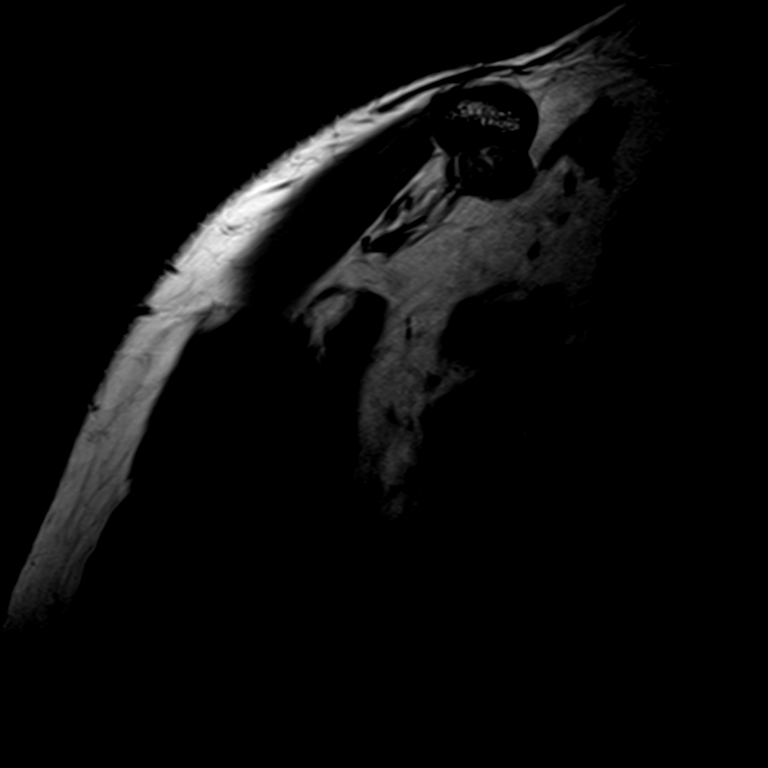
[im 4/28]
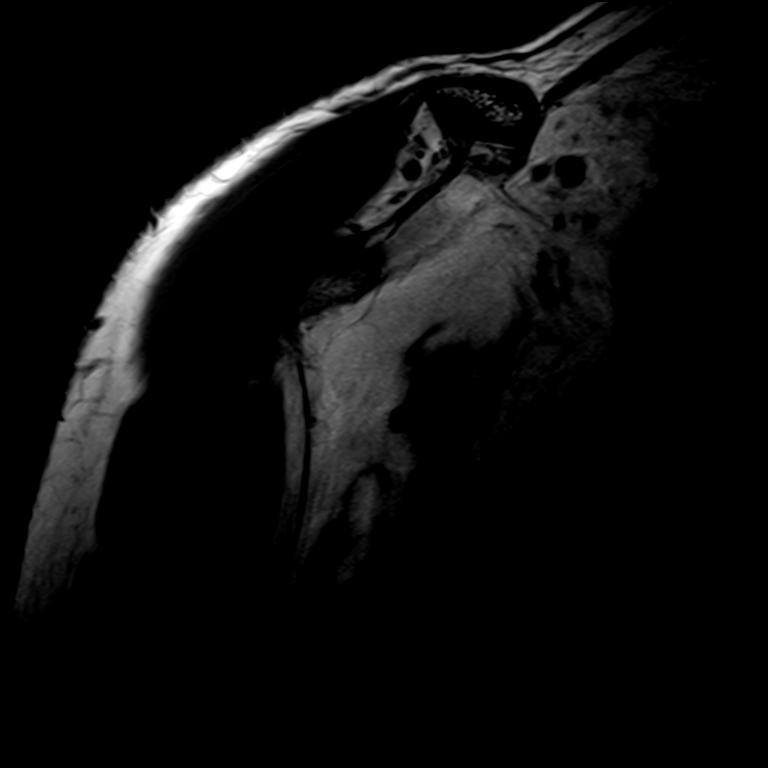
[im 8/28]
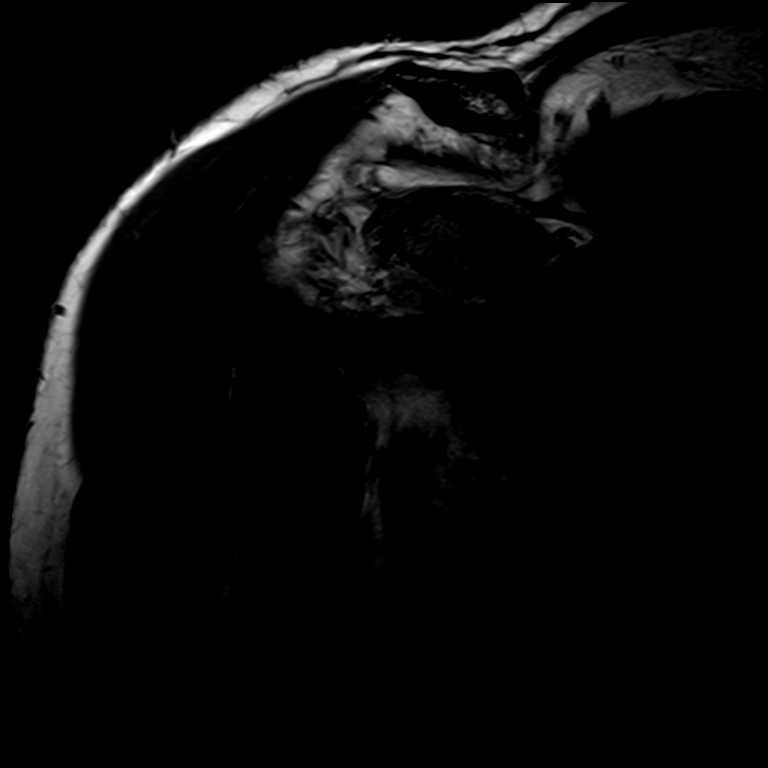
[im 16/28]
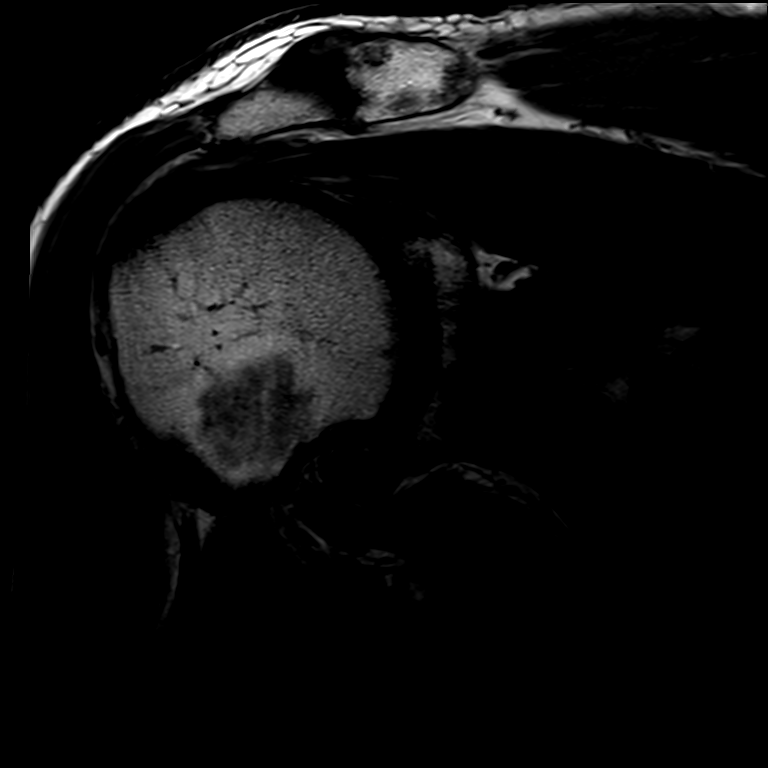
[im 24/28]
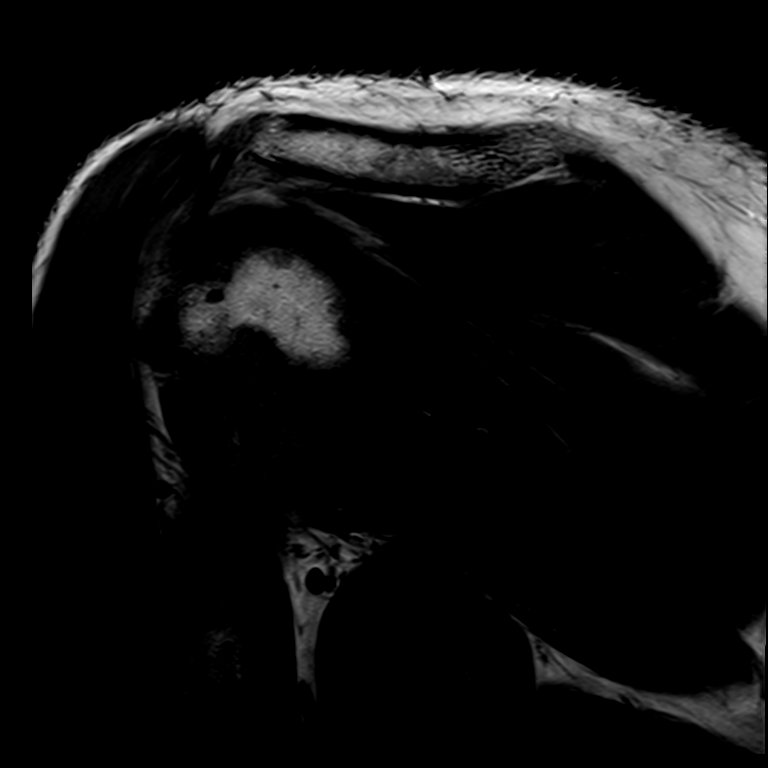

[Series 10: T2 fat-sat · oblique · right · 3.0mm · 0.22mm/px · 3 of 28 slices shown (3 of 3)]
[im 4/28]
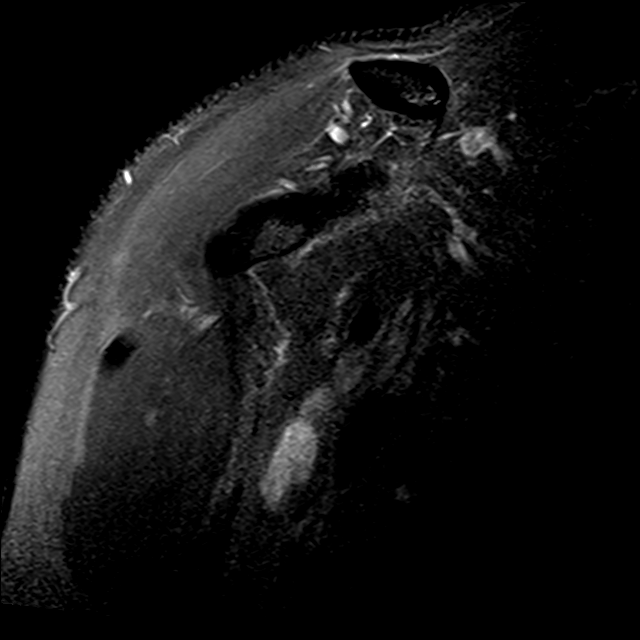
[im 16/28]
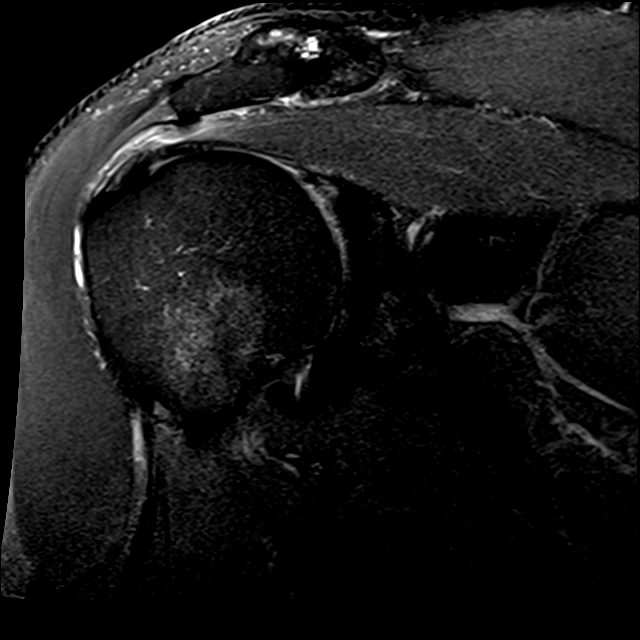
[im 24/28]
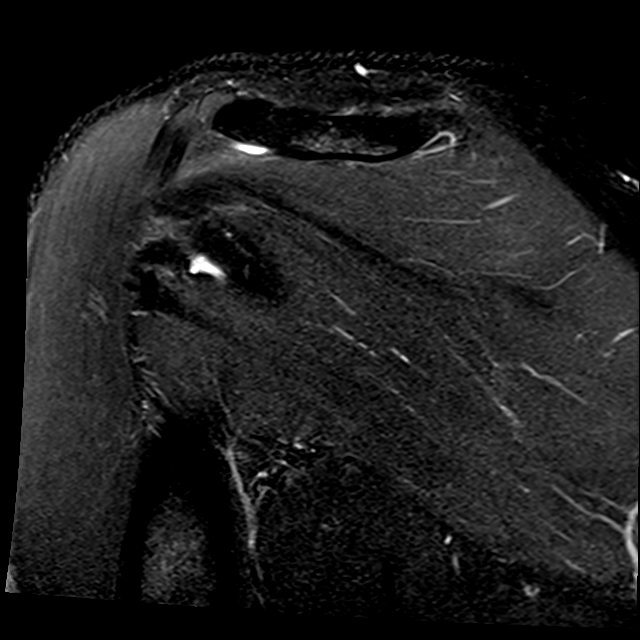

[14 of 40 positions shown; findings below may reference images not displayed]

FINDINGS: Rotator cuff: Distal intrasubstance partial-thickness supraspinatus
insertional tear on image [DATE] without discrete extension to the
bursal or articular surfaces. Moderate supraspinatus and
subscapularis tendinopathy with mild infraspinatus tendinopathy.

Muscles:  Unremarkable

Biceps long head: Partial tearing versus moderate tendinopathy of
the intra-articular segment.

Acromioclavicular Joint: Mild subcortical marrow edema and small
degenerative subcortical cystic lesions along the acromion with
minimal spurring. Type II acromion. Mild subacromial subdeltoid
bursitis.

Glenohumeral Joint: Mild degenerative chondral thinning along the
humeral head. Mildly thickened inferior glenohumeral ligament
diffusely. Thickened and somewhat indistinct coracohumeral ligament
with edema signal below the coracoid and tracking along the rotator
interval.

Labrum: Degenerative signal in the superior labrum without a
well-defined labral tear.

Bones: No significant extra-articular osseous abnormalities
identified.

Other: Scattered small axillary lymph nodes are visible.
IMPRESSION: 1. Moderate supraspinatus, subscapularis, and biceps tendinopathy
with mild infraspinatus tendinopathy.
2. Distal intrasubstance partial-thickness supraspinatus insertional
tearing. No bursal or articular surface extension.
3. Mild degenerative AC joint arthropathy and mild degenerative
chondral thinning along the humeral head.
4. Thickened inferior glenohumeral ligament along with edema signal
in the rotator interval, and indistinct coracohumeral ligament.
These are findings which can be encountered in the setting of
adhesive capsulitis. Alternatively the LIEN thickening could be due
to a remote injury.
5. Degenerative signal in the superior labrum without a well-defined
tear.

## 2019-05-06 NOTE — Progress Notes (Signed)
Patient Care Team: Marton Redwood, MD as PCP - General (Internal Medicine) Franchot Gallo, MD as Consulting Physician (Urology)  DIAGNOSIS:    ICD-10-CM   1. Chronic lymphocytic leukemia ([place])  C91.10 CBC with Differential (Cancer Center Only)    Lactate dehydrogenase (LDH)    CMP (Cancer Center only)    CHIEF COMPLIANT: Follow-up of CLL  INTERVAL HISTORY: Walter Scott is a 63 y.o. with above-mentioned history of CLL. He presents to the clinic today for follow-up.  He complains of night sweats but denies any fatigue or fevers or chills or weight loss or enlarged lymph nodes.  REVIEW OF SYSTEMS:   Constitutional: Denies fevers, chills or abnormal weight loss Eyes: Denies blurriness of vision Ears, nose, mouth, throat, and face: Denies mucositis or sore throat Respiratory: Denies cough, dyspnea or wheezes Cardiovascular: Denies palpitation, chest discomfort Gastrointestinal: Denies nausea, heartburn or change in bowel habits Skin: Denies abnormal skin rashes Lymphatics: Denies new lymphadenopathy or easy bruising Neurological: Denies numbness, tingling or new weaknesses Behavioral/Psych: Mood is stable, no new changes  Extremities: No lower extremity edema All other systems were reviewed with the patient and are negative.  I have reviewed the past medical history, past surgical history, social history and family history with the patient and they are unchanged from previous note.  ALLERGIES:  has No Known Allergies.  MEDICATIONS:  Current Outpatient Medications  Medication Sig Dispense Refill  . finasteride (PROSCAR) 5 MG tablet TK 1 T PO QD    . rosuvastatin (CRESTOR) 10 MG tablet TK 1 T PO D HS     No current facility-administered medications for this visit.     PHYSICAL EXAMINATION: ECOG PERFORMANCE STATUS: 1 - Symptomatic but completely ambulatory  Vitals:   05/07/19 0830  BP: (!) 149/88  Pulse: (!) 58  Resp: 20  Temp: 98 F (36.7 C)  SpO2: 99%    Filed Weights   05/07/19 0830  Weight: 217 lb (98.4 kg)    GENERAL: alert, no distress and comfortable SKIN: skin color, texture, turgor are normal, no rashes or significant lesions EYES: normal, Conjunctiva are pink and non-injected, sclera clear OROPHARYNX: no exudate, no erythema and lips, buccal mucosa, and tongue normal  NECK: supple, thyroid normal size, non-tender, without nodularity LYMPH: no palpable lymphadenopathy in the cervical, axillary or inguinal LUNGS: clear to auscultation and percussion with normal breathing effort HEART: regular rate & rhythm and no murmurs and no lower extremity edema ABDOMEN: abdomen soft, non-tender and normal bowel sounds MUSCULOSKELETAL: no cyanosis of digits and no clubbing  NEURO: alert & oriented x 3 with fluent speech, no focal motor/sensory deficits EXTREMITIES: No lower extremity edema  LABORATORY DATA:  I have reviewed the data as listed CMP Latest Ref Rng & Units 09/15/2018 08/22/2012 08/22/2011  Glucose 70 - 99 mg/dL 100(H) 87 85  BUN 6 - 20 mg/dL 24(H) 25(H) 24(H)  Creatinine 0.61 - 1.24 mg/dL 1.34(H) 1.3 1.2  Sodium 135 - 145 mmol/L 142 137 140  Potassium 3.5 - 5.1 mmol/L 4.9 3.8 4.5  Chloride 98 - 111 mmol/L 110 105 106  CO2 22 - 32 mmol/L 26 25 27   Calcium 8.9 - 10.3 mg/dL 8.8(L) 9.1 9.5  Total Protein 6.5 - 8.1 g/dL 6.7 7.1 7.1  Total Bilirubin 0.3 - 1.2 mg/dL 0.6 0.6 0.8  Alkaline Phos 38 - 126 U/L 59 55 57  AST 15 - 41 U/L 22 35 33  ALT 0 - 44 U/L 21 48 55(H)  Lab Results  Component Value Date   WBC 19.5 (H) 05/07/2019   HGB 15.5 05/07/2019   HCT 45.8 05/07/2019   MCV 88.9 05/07/2019   PLT 133 (L) 05/07/2019   NEUTROABS PENDING 05/07/2019    ASSESSMENT & PLAN:  Chronic lymphocytic leukemia ([place]) Flow Cytometry: 09/15/18: monoclonal B-cell population with expression of CD5 and CD23. Cytogenetics: Bi-allelic deletion of 99991111, No 17p del, No trisomy 12 Labs: 09/19/18: WBC: 18.4, Hb 14.9, Pl 142, ALC: 15.5 01/05/2019:  WBC 17.9, hemoglobin 14.7, platelets 128, ALC 13.3 05/07/2019: WBC 19.5, hemoglobin 15.5, platelets 133, ALC pending LDH and CMP are also pending from today.  We also discussed indications for treatment of CLL which include B symptoms like fevers chills night sweats and weight loss, rapid doubling time of 3 months with a lymphocyte count of rapidly enlarging lymph nodes.  He has none of them and therefore we can watch and monitor for now.  RTC in 6 months with labs and follow up.     Orders Placed This Encounter  Procedures  . CBC with Differential (Cancer Center Only)    Standing Status:   Future    Standing Expiration Date:   05/06/2020  . Lactate dehydrogenase (LDH)    Standing Status:   Future    Standing Expiration Date:   05/06/2020  . CMP ([facility] only)    Standing Status:   Future    Standing Expiration Date:   05/06/2020   The patient has a good understanding of the overall plan. he agrees with it. he will call with any problems that may develop before the next visit here.  Nicholas Lose, MD 05/07/2019  Julious Oka Dorshimer am acting as scribe for Dr. Nicholas Lose.  I have reviewed the above documentation for accuracy and completeness, and I agree with the above.

## 2019-05-06 NOTE — Assessment & Plan Note (Signed)
Flow Cytometry: 09/15/18: monoclonal B-cell population with expression of CD5 and CD23. Cytogenetics: Bi-allelic deletion of 99991111, No 17p del, No trisomy 12 Labs: 09/19/18: WBC: 18.4, Hb 14.9, Pl 142, ALC: 15.5 01/05/2019: WBC 17.9, hemoglobin 14.7, platelets 128, ALC 13.3  I discussed with him that the platelet count of 128 is not a concern.  If his platelets drop below 50 then we have to worry about risk of bleeding.  We also discussed indications for treatment of CLL which include B symptoms like fevers chills night sweats and weight loss, rapid doubling time of 3 months with a lymphocyte count of rapidly enlarging lymph nodes.  He has none of them and therefore we can watch and monitor for now.  RTC in 6 months with labs and follow up.

## 2019-05-07 ENCOUNTER — Telehealth: Payer: Self-pay | Admitting: Hematology and Oncology

## 2019-05-07 ENCOUNTER — Inpatient Hospital Stay: Payer: 59

## 2019-05-07 ENCOUNTER — Other Ambulatory Visit: Payer: Self-pay

## 2019-05-07 ENCOUNTER — Inpatient Hospital Stay: Payer: 59 | Attending: Hematology and Oncology | Admitting: Hematology and Oncology

## 2019-05-07 DIAGNOSIS — C9110 Chronic lymphocytic leukemia of B-cell type not having achieved remission: Secondary | ICD-10-CM

## 2019-05-07 LAB — CBC WITH DIFFERENTIAL (CANCER CENTER ONLY)
Abs Immature Granulocytes: 0.03 10*3/uL (ref 0.00–0.07)
Basophils Absolute: 0 10*3/uL (ref 0.0–0.1)
Basophils Relative: 0 %
Eosinophils Absolute: 0.1 10*3/uL (ref 0.0–0.5)
Eosinophils Relative: 1 %
HCT: 45.8 % (ref 39.0–52.0)
Hemoglobin: 15.5 g/dL (ref 13.0–17.0)
Immature Granulocytes: 0 %
Lymphocytes Relative: 74 %
Lymphs Abs: 14.4 10*3/uL — ABNORMAL HIGH (ref 0.7–4.0)
MCH: 30.1 pg (ref 26.0–34.0)
MCHC: 33.8 g/dL (ref 30.0–36.0)
MCV: 88.9 fL (ref 80.0–100.0)
Monocytes Absolute: 1.8 10*3/uL — ABNORMAL HIGH (ref 0.1–1.0)
Monocytes Relative: 9 %
Neutro Abs: 3.2 10*3/uL (ref 1.7–7.7)
Neutrophils Relative %: 16 %
Platelet Count: 133 10*3/uL — ABNORMAL LOW (ref 150–400)
RBC: 5.15 MIL/uL (ref 4.22–5.81)
RDW: 13 % (ref 11.5–15.5)
WBC Count: 19.5 10*3/uL — ABNORMAL HIGH (ref 4.0–10.5)
nRBC: 0 % (ref 0.0–0.2)

## 2019-05-07 LAB — LACTATE DEHYDROGENASE: LDH: 238 U/L — ABNORMAL HIGH (ref 98–192)

## 2019-05-07 LAB — CMP (CANCER CENTER ONLY)
ALT: 67 U/L — ABNORMAL HIGH (ref 0–44)
AST: 69 U/L — ABNORMAL HIGH (ref 15–41)
Albumin: 4.5 g/dL (ref 3.5–5.0)
Alkaline Phosphatase: 59 U/L (ref 38–126)
Anion gap: 9 (ref 5–15)
BUN: 25 mg/dL — ABNORMAL HIGH (ref 8–23)
CO2: 23 mmol/L (ref 22–32)
Calcium: 9.1 mg/dL (ref 8.9–10.3)
Chloride: 109 mmol/L (ref 98–111)
Creatinine: 1.4 mg/dL — ABNORMAL HIGH (ref 0.61–1.24)
GFR, Est AFR Am: >60 mL/min (ref >60)
GFR, Estimated: 54 mL/min — ABNORMAL LOW (ref >60)
Glucose, Bld: 100 mg/dL — ABNORMAL HIGH (ref 70–99)
Potassium: 4.1 mmol/L (ref 3.5–5.1)
Sodium: 141 mmol/L (ref 135–145)
Total Bilirubin: 0.7 mg/dL (ref 0.3–1.2)
Total Protein: 6.9 g/dL (ref 6.5–8.1)

## 2019-05-07 NOTE — Telephone Encounter (Signed)
I informed the patient that the LFTs were elevated and I suspect that this may be due to fatty liver.  He will discuss with his PCP and have cholesterol levels checked.  He will watch his diet and he is already exercising.  His creatinine is also continuing to go up.  I instructed him to increase his water intake.  None of these findings are related to CLL.

## 2019-05-08 ENCOUNTER — Telehealth: Payer: Self-pay | Admitting: Hematology and Oncology

## 2019-05-08 NOTE — Telephone Encounter (Signed)
I talk with patient regarding schedule  

## 2019-06-01 ENCOUNTER — Other Ambulatory Visit: Payer: Self-pay

## 2019-06-01 DIAGNOSIS — Z20822 Contact with and (suspected) exposure to covid-19: Secondary | ICD-10-CM

## 2019-06-03 LAB — NOVEL CORONAVIRUS, NAA: SARS-CoV-2, NAA: NOT DETECTED

## 2019-11-04 NOTE — Progress Notes (Signed)
Patient Care Team: Marton Redwood, MD as PCP - General (Internal Medicine) Franchot Gallo, MD as Consulting Physician (Urology)  DIAGNOSIS:    ICD-10-CM   1. Chronic lymphocytic leukemia ([place])  C91.10 CBC with Differential (Cancer Center Only)    CMP ([facility] only)    Lactate dehydrogenase (LDH)    CHIEF COMPLIANT: Follow-up of CLL  INTERVAL HISTORY: Walter Scott is a 64 y.o. with above-mentioned history of CLL. He presents to the clinic today for follow-up. No B symptoms.   ALLERGIES:  has No Known Allergies.  MEDICATIONS:  Current Outpatient Medications  Medication Sig Dispense Refill  . finasteride (PROSCAR) 5 MG tablet TK 1 T PO QD    . rosuvastatin (CRESTOR) 10 MG tablet TK 1 T PO D HS     No current facility-administered medications for this visit.    PHYSICAL EXAMINATION: ECOG PERFORMANCE STATUS: 1 - Symptomatic but completely ambulatory  Vitals:   11/05/19 0821  BP: 125/81  Pulse: (!) 57  Resp: 17  Temp: 98.4 F (36.9 C)  SpO2: 97%   Filed Weights   11/05/19 0821  Weight: 222 lb 11.2 oz (101 kg)    LABORATORY DATA:  I have reviewed the data as listed CMP Latest Ref Rng & Units 05/07/2019 09/15/2018 08/22/2012  Glucose 70 - 99 mg/dL 100(H) 100(H) 87  BUN 8 - 23 mg/dL 25(H) 24(H) 25(H)  Creatinine 0.61 - 1.24 mg/dL 1.40(H) 1.34(H) 1.3  Sodium 135 - 145 mmol/L 141 142 137  Potassium 3.5 - 5.1 mmol/L 4.1 4.9 3.8  Chloride 98 - 111 mmol/L 109 110 105  CO2 22 - 32 mmol/L 23 26 25   Calcium 8.9 - 10.3 mg/dL 9.1 8.8(L) 9.1  Total Protein 6.5 - 8.1 g/dL 6.9 6.7 7.1  Total Bilirubin 0.3 - 1.2 mg/dL 0.7 0.6 0.6  Alkaline Phos 38 - 126 U/L 59 59 55  AST 15 - 41 U/L 69(H) 22 35  ALT 0 - 44 U/L 67(H) 21 48    Lab Results  Component Value Date   WBC 22.9 (H) 11/05/2019   HGB 15.6 11/05/2019   HCT 46.0 11/05/2019   MCV 89.3 11/05/2019   PLT 138 (L) 11/05/2019   NEUTROABS 3.0 11/05/2019    ASSESSMENT & PLAN:  Chronic lymphocytic leukemia  ([facility]) Flow Cytometry: 09/15/18:monoclonal B-cell population with expression of CD5 and CD23. Cytogenetics: Bi-allelic deletion of 99991111, No 17p del, No trisomy 12 Labs: 09/19/18: WBC: 18.4, Hb 14.9, Pl 142, ALC: 15.5 01/05/2019: WBC 17.9, hemoglobin 14.7, platelets 128, ALC 13.3 05/07/2019: WBC 19.5, hemoglobin 15.5, platelets 133, ALC 14.4 11/05/2019: WBC: 22.9, ALC: 18.8, platelets 138  Currently patient does not have indications for treatment. Previously patient had elevation of AST ALT and serum creatinine and he is following with his primary care physician for all of that.  I will call him with the results of the pending labs. Return to clinic in 6 months with labs and follow-up at     Orders Placed This Encounter  Procedures  . CBC with Differential (Cancer Center Only)    Standing Status:   Future    Standing Expiration Date:   11/04/2020  . CMP ([facility] only)    Standing Status:   Future    Standing Expiration Date:   11/04/2020  . Lactate dehydrogenase (LDH)    Standing Status:   Future    Standing Expiration Date:   11/04/2020   The patient has a good understanding of the overall plan.  he agrees with it. he will call with any problems that may develop before the next visit here.  Total time spent: 20 mins including face to face time and time spent for planning, charting and coordination of care  Nicholas Lose, MD 11/05/2019  I, Cloyde Reams Dorshimer, am acting as scribe for Dr. Nicholas Lose.  I have reviewed the above documentation for accuracy and completeness, and I agree with the above.

## 2019-11-05 ENCOUNTER — Inpatient Hospital Stay: Payer: 59

## 2019-11-05 ENCOUNTER — Telehealth: Payer: Self-pay | Admitting: Hematology and Oncology

## 2019-11-05 ENCOUNTER — Other Ambulatory Visit: Payer: Self-pay

## 2019-11-05 ENCOUNTER — Inpatient Hospital Stay: Payer: 59 | Attending: Hematology and Oncology | Admitting: Hematology and Oncology

## 2019-11-05 DIAGNOSIS — C9110 Chronic lymphocytic leukemia of B-cell type not having achieved remission: Secondary | ICD-10-CM

## 2019-11-05 LAB — CBC WITH DIFFERENTIAL (CANCER CENTER ONLY)
Abs Immature Granulocytes: 0.03 10*3/uL (ref 0.00–0.07)
Basophils Absolute: 0.1 10*3/uL (ref 0.0–0.1)
Basophils Relative: 0 %
Eosinophils Absolute: 0.2 10*3/uL (ref 0.0–0.5)
Eosinophils Relative: 1 %
HCT: 46 % (ref 39.0–52.0)
Hemoglobin: 15.6 g/dL (ref 13.0–17.0)
Immature Granulocytes: 0 %
Lymphocytes Relative: 82 %
Lymphs Abs: 18.8 10*3/uL — ABNORMAL HIGH (ref 0.7–4.0)
MCH: 30.3 pg (ref 26.0–34.0)
MCHC: 33.9 g/dL (ref 30.0–36.0)
MCV: 89.3 fL (ref 80.0–100.0)
Monocytes Absolute: 0.8 10*3/uL (ref 0.1–1.0)
Monocytes Relative: 4 %
Neutro Abs: 3 10*3/uL (ref 1.7–7.7)
Neutrophils Relative %: 13 %
Platelet Count: 138 10*3/uL — ABNORMAL LOW (ref 150–400)
RBC: 5.15 MIL/uL (ref 4.22–5.81)
RDW: 13 % (ref 11.5–15.5)
WBC Count: 22.9 10*3/uL — ABNORMAL HIGH (ref 4.0–10.5)
nRBC: 0 % (ref 0.0–0.2)

## 2019-11-05 LAB — CMP (CANCER CENTER ONLY)
ALT: 20 U/L (ref 0–44)
AST: 18 U/L (ref 15–41)
Albumin: 4.2 g/dL (ref 3.5–5.0)
Alkaline Phosphatase: 58 U/L (ref 38–126)
Anion gap: 6 (ref 5–15)
BUN: 25 mg/dL — ABNORMAL HIGH (ref 8–23)
CO2: 21 mmol/L — ABNORMAL LOW (ref 22–32)
Calcium: 8.9 mg/dL (ref 8.9–10.3)
Chloride: 110 mmol/L (ref 98–111)
Creatinine: 1.34 mg/dL — ABNORMAL HIGH (ref 0.61–1.24)
GFR, Est AFR Am: >60 mL/min (ref >60)
GFR, Estimated: 56 mL/min — ABNORMAL LOW (ref >60)
Glucose, Bld: 110 mg/dL — ABNORMAL HIGH (ref 70–99)
Potassium: 4.5 mmol/L (ref 3.5–5.1)
Sodium: 137 mmol/L (ref 135–145)
Total Bilirubin: 0.5 mg/dL (ref 0.3–1.2)
Total Protein: 6.6 g/dL (ref 6.5–8.1)

## 2019-11-05 LAB — LACTATE DEHYDROGENASE: LDH: 210 U/L — ABNORMAL HIGH (ref 98–192)

## 2019-11-05 NOTE — Telephone Encounter (Signed)
I informed the patient the results of CMP. His fasting blood sugar is elevated to 110.  I instructed him to watch his diet and especially the carbs and check with the primary care physician to be monitored for diabetes. His creatinine is 1.34 it is stable from last year however I feel that it is secondary to not drinking adequate water.  I stressed the importance of increasing his water intake. His LFTs have now come back down to normal.

## 2019-11-05 NOTE — Assessment & Plan Note (Signed)
Flow Cytometry: 09/15/18:monoclonal B-cell population with expression of CD5 and CD23. Cytogenetics: Bi-allelic deletion of 99991111, No 17p del, No trisomy 12 Labs: 09/19/18: WBC: 18.4, Hb 14.9, Pl 142, ALC: 15.5 01/05/2019: WBC 17.9, hemoglobin 14.7, platelets 128, ALC 13.3 05/07/2019: WBC 19.5, hemoglobin 15.5, platelets 133, ALC 14.4 11/05/2019:  Currently patient does not have indications for treatment. Previously patient had elevation of AST ALT and serum creatinine and he is following with his primary care physician for all of that.  I will call him with the results of the pending labs. Return to clinic in 1 year with labs and follow-up at

## 2019-11-06 ENCOUNTER — Telehealth: Payer: Self-pay | Admitting: Hematology and Oncology

## 2019-11-06 NOTE — Telephone Encounter (Signed)
Scheduled per 04/22 los, patient has been called and notified.  

## 2020-05-06 ENCOUNTER — Other Ambulatory Visit: Payer: Self-pay

## 2020-05-06 ENCOUNTER — Inpatient Hospital Stay: Payer: 59 | Attending: Hematology and Oncology

## 2020-05-06 DIAGNOSIS — C9110 Chronic lymphocytic leukemia of B-cell type not having achieved remission: Secondary | ICD-10-CM

## 2020-05-06 LAB — CMP (CANCER CENTER ONLY)
ALT: 21 U/L (ref 0–44)
AST: 21 U/L (ref 15–41)
Albumin: 4.3 g/dL (ref 3.5–5.0)
Alkaline Phosphatase: 55 U/L (ref 38–126)
Anion gap: 4 — ABNORMAL LOW (ref 5–15)
BUN: 24 mg/dL — ABNORMAL HIGH (ref 8–23)
CO2: 27 mmol/L (ref 22–32)
Calcium: 9.4 mg/dL (ref 8.9–10.3)
Chloride: 109 mmol/L (ref 98–111)
Creatinine: 1.27 mg/dL — ABNORMAL HIGH (ref 0.61–1.24)
GFR, Estimated: >60 mL/min (ref >60)
Glucose, Bld: 103 mg/dL — ABNORMAL HIGH (ref 70–99)
Potassium: 4.3 mmol/L (ref 3.5–5.1)
Sodium: 140 mmol/L (ref 135–145)
Total Bilirubin: 0.5 mg/dL (ref 0.3–1.2)
Total Protein: 6.5 g/dL (ref 6.5–8.1)

## 2020-05-06 LAB — CBC WITH DIFFERENTIAL (CANCER CENTER ONLY)
Abs Immature Granulocytes: 0 10*3/uL (ref 0.00–0.07)
Basophils Absolute: 0 10*3/uL (ref 0.0–0.1)
Basophils Relative: 0 %
Eosinophils Absolute: 0 10*3/uL (ref 0.0–0.5)
Eosinophils Relative: 0 %
HCT: 42.6 % (ref 39.0–52.0)
Hemoglobin: 14.3 g/dL (ref 13.0–17.0)
Lymphocytes Relative: 80 %
Lymphs Abs: 15.7 10*3/uL — ABNORMAL HIGH (ref 0.7–4.0)
MCH: 29.7 pg (ref 26.0–34.0)
MCHC: 33.6 g/dL (ref 30.0–36.0)
MCV: 88.4 fL (ref 80.0–100.0)
Monocytes Absolute: 0.6 10*3/uL (ref 0.1–1.0)
Monocytes Relative: 3 %
Neutro Abs: 3.3 10*3/uL (ref 1.7–7.7)
Neutrophils Relative %: 17 %
Platelet Count: 125 10*3/uL — ABNORMAL LOW (ref 150–400)
RBC: 4.82 MIL/uL (ref 4.22–5.81)
RDW: 13 % (ref 11.5–15.5)
WBC Count: 19.6 10*3/uL — ABNORMAL HIGH (ref 4.0–10.5)
nRBC: 0 % (ref 0.0–0.2)

## 2020-05-06 LAB — LACTATE DEHYDROGENASE: LDH: 191 U/L (ref 98–192)

## 2020-09-19 ENCOUNTER — Other Ambulatory Visit (HOSPITAL_COMMUNITY): Payer: Self-pay | Admitting: Internal Medicine

## 2020-09-19 MED FILL — PAXLOVID 20 X 150 MG & 10 X: 20 X 150 MG | 5 days supply | Qty: 20 | Fill #0

## 2022-02-27 ENCOUNTER — Inpatient Hospital Stay: Payer: 59 | Attending: Hematology and Oncology | Admitting: Hematology and Oncology

## 2022-02-27 ENCOUNTER — Other Ambulatory Visit: Payer: Self-pay | Admitting: Hematology and Oncology

## 2022-02-27 ENCOUNTER — Inpatient Hospital Stay: Payer: 59

## 2022-02-27 ENCOUNTER — Other Ambulatory Visit: Payer: Self-pay

## 2022-02-27 DIAGNOSIS — C9110 Chronic lymphocytic leukemia of B-cell type not having achieved remission: Secondary | ICD-10-CM

## 2022-02-27 LAB — CBC WITH DIFFERENTIAL (CANCER CENTER ONLY)
Abs Immature Granulocytes: 0.03 10*3/uL (ref 0.00–0.07)
Basophils Absolute: 0.1 10*3/uL (ref 0.0–0.1)
Basophils Relative: 0 %
Eosinophils Absolute: 0.1 10*3/uL (ref 0.0–0.5)
Eosinophils Relative: 1 %
HCT: 44.4 % (ref 39.0–52.0)
Hemoglobin: 15.3 g/dL (ref 13.0–17.0)
Immature Granulocytes: 0 %
Lymphocytes Relative: 80 %
Lymphs Abs: 19.9 10*3/uL — ABNORMAL HIGH (ref 0.7–4.0)
MCH: 30.4 pg (ref 26.0–34.0)
MCHC: 34.5 g/dL (ref 30.0–36.0)
MCV: 88.3 fL (ref 80.0–100.0)
Monocytes Absolute: 1.6 10*3/uL — ABNORMAL HIGH (ref 0.1–1.0)
Monocytes Relative: 7 %
Neutro Abs: 3.1 10*3/uL (ref 1.7–7.7)
Neutrophils Relative %: 12 %
Platelet Count: 130 10*3/uL — ABNORMAL LOW (ref 150–400)
RBC: 5.03 MIL/uL (ref 4.22–5.81)
RDW: 13.2 % (ref 11.5–15.5)
Smear Review: NORMAL
WBC Count: 24.8 10*3/uL — ABNORMAL HIGH (ref 4.0–10.5)
nRBC: 0 % (ref 0.0–0.2)

## 2022-02-27 LAB — CMP (CANCER CENTER ONLY)
ALT: 18 U/L (ref 0–44)
AST: 20 U/L (ref 15–41)
Albumin: 4.5 g/dL (ref 3.5–5.0)
Alkaline Phosphatase: 45 U/L (ref 38–126)
Anion gap: 5 (ref 5–15)
BUN: 35 mg/dL — ABNORMAL HIGH (ref 8–23)
CO2: 22 mmol/L (ref 22–32)
Calcium: 9.4 mg/dL (ref 8.9–10.3)
Chloride: 112 mmol/L — ABNORMAL HIGH (ref 98–111)
Creatinine: 1.43 mg/dL — ABNORMAL HIGH (ref 0.61–1.24)
GFR, Estimated: 55 mL/min — ABNORMAL LOW (ref >60)
Glucose, Bld: 85 mg/dL (ref 70–99)
Potassium: 4.6 mmol/L (ref 3.5–5.1)
Sodium: 139 mmol/L (ref 135–145)
Total Bilirubin: 0.9 mg/dL (ref 0.3–1.2)
Total Protein: 6.8 g/dL (ref 6.5–8.1)

## 2022-02-27 LAB — LACTATE DEHYDROGENASE: LDH: 167 U/L (ref 98–192)

## 2022-02-27 NOTE — Progress Notes (Signed)
Patient Care Team: Ginger Organ., MD as PCP - General (Internal Medicine) Franchot Gallo, MD as Consulting Physician (Urology)  DIAGNOSIS:  Encounter Diagnosis  Name Primary?   Chronic lymphocytic leukemia ([place])      CHIEF COMPLIANT: Follow-up of CLL  INTERVAL HISTORY: Walter Scott is a 66 y.o. with above-mentioned history of CLL. He presents to the clinic today for follow-up. He states that he is doing great. He is very active in tennis and he is getting ready to run a marathon. Overall he is feeling pretty good.  He denies any fevers chills night sweats weight loss fatigue or enlarged lymph nodes.     ALLERGIES:  has No Known Allergies.  MEDICATIONS:  Current Outpatient Medications  Medication Sig Dispense Refill   finasteride (PROSCAR) 5 MG tablet TK 1 T PO QD     rosuvastatin (CRESTOR) 10 MG tablet TK 1 T PO D HS     No current facility-administered medications for this visit.    PHYSICAL EXAMINATION: ECOG PERFORMANCE STATUS: 1 - Symptomatic but completely ambulatory  Vitals:   02/27/22 0900  BP: 124/82  Pulse: (!) 55  Resp: 18  Temp: 97.8 F (36.6 C)  SpO2: 97%   Filed Weights   02/27/22 0900  Weight: 213 lb 11.2 oz (96.9 kg)    BREAST: No palpable masses or nodules in either right or left breasts. No palpable axillary supraclavicular or infraclavicular adenopathy no breast tenderness or nipple discharge. (exam performed in the presence of a chaperone)  LABORATORY DATA:  I have reviewed the data as listed    Latest Ref Rng & Units 05/06/2020    1:10 PM 11/05/2019    8:05 AM 05/07/2019    8:13 AM  CMP  Glucose 70 - 99 mg/dL 103  110  100   BUN 8 - 23 mg/dL '24  25  25   '$ Creatinine 0.61 - 1.24 mg/dL 1.27  1.34  1.40   Sodium 135 - 145 mmol/L 140  137  141   Potassium 3.5 - 5.1 mmol/L 4.3  4.5  4.1   Chloride 98 - 111 mmol/L 109  110  109   CO2 22 - 32 mmol/L '27  21  23   '$ Calcium 8.9 - 10.3 mg/dL 9.4  8.9  9.1   Total Protein 6.5 - 8.1  g/dL 6.5  6.6  6.9   Total Bilirubin 0.3 - 1.2 mg/dL 0.5  0.5  0.7   Alkaline Phos 38 - 126 U/L 55  58  59   AST 15 - 41 U/L 21  18  69   ALT 0 - 44 U/L 21  20  67     Lab Results  Component Value Date   WBC 24.8 (H) 02/27/2022   HGB 15.3 02/27/2022   HCT 44.4 02/27/2022   MCV 88.3 02/27/2022   PLT 130 (L) 02/27/2022   NEUTROABS 3.1 02/27/2022    ASSESSMENT & PLAN:  Chronic lymphocytic leukemia (HCC) Flow Cytometry: 09/15/18: monoclonal B-cell population with expression of CD5 and CD23. Cytogenetics: Bi-allelic deletion of 35T61, No 17p del, No trisomy 12 Labs: 09/19/18: WBC: 18.4, Hb 14.9, Pl 142, ALC: 15.5 01/05/2019: WBC 17.9, hemoglobin 14.7, platelets 128, ALC 13.3 05/07/2019: WBC 19.5, hemoglobin 15.5, platelets 133, ALC 14.4 11/05/2019: WBC: 22.9, ALC: 18.8, platelets 138 02/27/2022: WBC 24.8, hemoglobin 15.3, platelets 130, ALC 19.9   Currently patient does not have indications for treatment. I did gust with him extensively about the indications  for treatment.  Return to clinic in 1 year for follow-up with labs    Orders Placed This Encounter  Procedures   CBC with Differential ([facility] Only)    Standing Status:   Future    Standing Expiration Date:   02/28/2023   CMP ([facility] only)    Standing Status:   Future    Standing Expiration Date:   02/28/2023   Lactate dehydrogenase    Standing Status:   Future    Standing Expiration Date:   02/28/2023   The patient has a good understanding of the overall plan. he agrees with it. he will call with any problems that may develop before the next visit here. Total time spent: 30 mins including face to face time and time spent for planning, charting and co-ordination of care   Harriette Ohara, MD 02/27/22    I Gardiner Coins am scribing for Dr. Lindi Adie  I have reviewed the above documentation for accuracy and completeness, and I agree with the above.

## 2022-02-27 NOTE — Assessment & Plan Note (Addendum)
Flow Cytometry: 09/15/18:monoclonal B-cell population with expression of CD5 and CD23. Cytogenetics: Bi-allelic deletion of 53U02, No 17p del, No trisomy 12 Labs: 09/19/18: WBC: 18.4, Hb 14.9, Pl 142, ALC: 15.5 01/05/2019: WBC 17.9, hemoglobin 14.7, platelets 128, ALC 13.3 05/07/2019: WBC 19.5, hemoglobin 15.5, platelets 133, ALC 14.4 11/05/2019: WBC: 22.9, ALC: 18.8, platelets 138 02/27/2022: WBC 24.8, hemoglobin 15.3, platelets 130  Currently patient does not have indications for treatment. Return to clinic in 1 year for follow-up with labs

## 2022-10-23 ENCOUNTER — Other Ambulatory Visit: Payer: Self-pay | Admitting: Internal Medicine

## 2022-10-23 DIAGNOSIS — Z136 Encounter for screening for cardiovascular disorders: Secondary | ICD-10-CM

## 2023-01-28 ENCOUNTER — Other Ambulatory Visit: Payer: Self-pay | Admitting: Adult Health

## 2023-01-28 DIAGNOSIS — R972 Elevated prostate specific antigen [PSA]: Secondary | ICD-10-CM

## 2023-02-26 NOTE — Progress Notes (Signed)
   Patient Care Team: Cleatis Polka., MD as PCP - General (Internal Medicine) Marcine Matar, MD as Consulting Physician (Urology)  DIAGNOSIS: No diagnosis found.  SUMMARY OF ONCOLOGIC HISTORY: Oncology History   No history exists.    CHIEF COMPLIANT:   INTERVAL HISTORY: Walter Scott is a   ALLERGIES:  has No Known Allergies.  MEDICATIONS:  Current Outpatient Medications  Medication Sig Dispense Refill   finasteride (PROSCAR) 5 MG tablet TK 1 T PO QD     rosuvastatin (CRESTOR) 10 MG tablet TK 1 T PO D HS     No current facility-administered medications for this visit.    PHYSICAL EXAMINATION: ECOG PERFORMANCE STATUS: {CHL ONC ECOG [id number]}  There were no vitals filed for this visit. There were no vitals filed for this visit.  BREAST:*** No palpable masses or nodules in either right or left breasts. No palpable axillary supraclavicular or infraclavicular adenopathy no breast tenderness or nipple discharge. (exam performed in the presence of a chaperone)  LABORATORY DATA:  I have reviewed the data as listed    Latest Ref Rng & Units 02/27/2022    8:49 AM 05/06/2020    1:10 PM 11/05/2019    8:05 AM  CMP  Glucose 70 - 99 mg/dL 85  161  096   BUN 8 - 23 mg/dL 35  24  25   Creatinine 0.61 - 1.24 mg/dL 0.45  4.09  8.11   Sodium 135 - 145 mmol/L 139  140  137   Potassium 3.5 - 5.1 mmol/L 4.6  4.3  4.5   Chloride 98 - 111 mmol/L 112  109  110   CO2 22 - 32 mmol/L 22  27  21    Calcium 8.9 - 10.3 mg/dL 9.4  9.4  8.9   Total Protein 6.5 - 8.1 g/dL 6.8  6.5  6.6   Total Bilirubin 0.3 - 1.2 mg/dL 0.9  0.5  0.5   Alkaline Phos 38 - 126 U/L 45  55  58   AST 15 - 41 U/L 20  21  18    ALT 0 - 44 U/L 18  21  20      Lab Results  Component Value Date   WBC 24.8 (H) 02/27/2022   HGB 15.3 02/27/2022   HCT 44.4 02/27/2022   MCV 88.3 02/27/2022   PLT 130 (L) 02/27/2022   NEUTROABS 3.1 02/27/2022    ASSESSMENT & PLAN:  No problem-specific Assessment & Plan  notes found for this encounter.    No orders of the defined types were placed in this encounter.  The patient has a good understanding of the overall plan. he agrees with it. he will call with any problems that may develop before the next visit here. Total time spent: 30 mins including face to face time and time spent for planning, charting and co-ordination of care   Sherlyn Lick, CMA 02/26/23    I Janan Ridge am acting as a Neurosurgeon for [employer]  ***

## 2023-02-28 ENCOUNTER — Other Ambulatory Visit: Payer: Self-pay

## 2023-02-28 ENCOUNTER — Inpatient Hospital Stay: Payer: Medicare Other

## 2023-02-28 ENCOUNTER — Inpatient Hospital Stay: Payer: Medicare Other | Attending: Hematology and Oncology | Admitting: Hematology and Oncology

## 2023-02-28 VITALS — BP 131/75 | HR 57 | Temp 97.3°F | Resp 18 | Ht 72.0 in | Wt 213.9 lb

## 2023-02-28 DIAGNOSIS — C9110 Chronic lymphocytic leukemia of B-cell type not having achieved remission: Secondary | ICD-10-CM

## 2023-02-28 LAB — CMP (CANCER CENTER ONLY)
ALT: 17 U/L (ref 0–44)
AST: 20 U/L (ref 15–41)
Albumin: 4.5 g/dL (ref 3.5–5.0)
Alkaline Phosphatase: 49 U/L (ref 38–126)
Anion gap: 6 (ref 5–15)
BUN: 36 mg/dL — ABNORMAL HIGH (ref 8–23)
CO2: 24 mmol/L (ref 22–32)
Calcium: 9.1 mg/dL (ref 8.9–10.3)
Chloride: 110 mmol/L (ref 98–111)
Creatinine: 1.37 mg/dL — ABNORMAL HIGH (ref 0.61–1.24)
GFR, Estimated: 57 mL/min — ABNORMAL LOW (ref >60)
Glucose, Bld: 102 mg/dL — ABNORMAL HIGH (ref 70–99)
Potassium: 4.4 mmol/L (ref 3.5–5.1)
Sodium: 140 mmol/L (ref 135–145)
Total Bilirubin: 0.6 mg/dL (ref 0.3–1.2)
Total Protein: 6.6 g/dL (ref 6.5–8.1)

## 2023-02-28 LAB — CBC WITH DIFFERENTIAL (CANCER CENTER ONLY)
Abs Immature Granulocytes: 0.03 10*3/uL (ref 0.00–0.07)
Basophils Absolute: 0.1 10*3/uL (ref 0.0–0.1)
Basophils Relative: 0 %
Eosinophils Absolute: 0.1 10*3/uL (ref 0.0–0.5)
Eosinophils Relative: 1 %
HCT: 45.2 % (ref 39.0–52.0)
Hemoglobin: 15.9 g/dL (ref 13.0–17.0)
Immature Granulocytes: 0 %
Lymphocytes Relative: 86 %
Lymphs Abs: 21 10*3/uL — ABNORMAL HIGH (ref 0.7–4.0)
MCH: 31.2 pg (ref 26.0–34.0)
MCHC: 35.2 g/dL (ref 30.0–36.0)
MCV: 88.6 fL (ref 80.0–100.0)
Monocytes Absolute: 1 10*3/uL (ref 0.1–1.0)
Monocytes Relative: 4 %
Neutro Abs: 2.3 10*3/uL (ref 1.7–7.7)
Neutrophils Relative %: 9 %
Platelet Count: 125 10*3/uL — ABNORMAL LOW (ref 150–400)
RBC: 5.1 MIL/uL (ref 4.22–5.81)
RDW: 13.2 % (ref 11.5–15.5)
Smear Review: NORMAL
WBC Count: 24.6 10*3/uL — ABNORMAL HIGH (ref 4.0–10.5)
nRBC: 0.1 % (ref 0.0–0.2)

## 2023-02-28 LAB — LACTATE DEHYDROGENASE: LDH: 178 U/L (ref 98–192)

## 2023-02-28 NOTE — Assessment & Plan Note (Signed)
Flow Cytometry: 09/15/18: monoclonal B-cell population with expression of CD5 and CD23. Cytogenetics: Bi-allelic deletion of 13q14, No 17p del, No trisomy 12 Labs: 09/19/18: WBC: 18.4, Hb 14.9, Pl 142, ALC: 15.5 01/05/2019: WBC 17.9, hemoglobin 14.7, platelets 128, ALC 13.3 05/07/2019: WBC 19.5, hemoglobin 15.5, platelets 133, ALC 14.4 11/05/2019: WBC: 22.9, ALC: 18.8, platelets 138 02/27/2022: WBC 24.8, hemoglobin 15.3, platelets 130, ALC 19.9   Currently patient does not have indications for treatment. I did gust with him extensively about the indications for treatment.   Return to clinic in 1 year for follow-up with labs

## 2023-03-09 ENCOUNTER — Ambulatory Visit
Admission: RE | Admit: 2023-03-09 | Discharge: 2023-03-09 | Disposition: A | Discharge status: Home / Self Care | Payer: Medicare Other | Source: Ambulatory Visit | Attending: Adult Health | Admitting: Adult Health

## 2023-03-09 DIAGNOSIS — R972 Elevated prostate specific antigen [PSA]: Secondary | ICD-10-CM | POA: Diagnosis not present

## 2023-03-09 MED ORDER — GADOPICLENOL 0.5 MMOL/ML IV SOLN
10.0000 mL | Freq: Once | INTRAVENOUS | Status: AC | PRN
  Administered 2023-03-09: 10 mL via INTRAVENOUS

## 2023-03-09 MED ORDER — GADOPICLENOL 0.5 MMOL/ML IV SOLN: 8.0000 mL | Freq: Once | INTRAVENOUS | Status: DC | PRN

## 2023-04-01 DIAGNOSIS — N401 Enlarged prostate with lower urinary tract symptoms: Secondary | ICD-10-CM | POA: Diagnosis not present

## 2023-04-01 DIAGNOSIS — R350 Frequency of micturition: Secondary | ICD-10-CM | POA: Diagnosis not present

## 2023-04-01 DIAGNOSIS — R972 Elevated prostate specific antigen [PSA]: Secondary | ICD-10-CM | POA: Diagnosis not present

## 2023-04-01 DIAGNOSIS — R351 Nocturia: Secondary | ICD-10-CM | POA: Diagnosis not present

## 2023-05-28 DIAGNOSIS — N411 Chronic prostatitis: Secondary | ICD-10-CM | POA: Diagnosis not present

## 2023-05-28 DIAGNOSIS — N4289 Other specified disorders of prostate: Secondary | ICD-10-CM | POA: Diagnosis not present

## 2023-05-28 DIAGNOSIS — R972 Elevated prostate specific antigen [PSA]: Secondary | ICD-10-CM | POA: Diagnosis not present

## 2023-05-29 DIAGNOSIS — H524 Presbyopia: Secondary | ICD-10-CM | POA: Diagnosis not present

## 2023-06-04 DIAGNOSIS — R972 Elevated prostate specific antigen [PSA]: Secondary | ICD-10-CM | POA: Diagnosis not present

## 2023-06-04 DIAGNOSIS — N401 Enlarged prostate with lower urinary tract symptoms: Secondary | ICD-10-CM | POA: Diagnosis not present

## 2023-06-04 DIAGNOSIS — R350 Frequency of micturition: Secondary | ICD-10-CM | POA: Diagnosis not present

## 2023-08-14 DIAGNOSIS — D225 Melanocytic nevi of trunk: Secondary | ICD-10-CM | POA: Diagnosis not present

## 2023-08-14 DIAGNOSIS — L812 Freckles: Secondary | ICD-10-CM | POA: Diagnosis not present

## 2023-08-14 DIAGNOSIS — Z85828 Personal history of other malignant neoplasm of skin: Secondary | ICD-10-CM | POA: Diagnosis not present

## 2023-08-14 DIAGNOSIS — D2262 Melanocytic nevi of left upper limb, including shoulder: Secondary | ICD-10-CM | POA: Diagnosis not present

## 2023-08-14 DIAGNOSIS — L570 Actinic keratosis: Secondary | ICD-10-CM | POA: Diagnosis not present

## 2023-09-04 DIAGNOSIS — J029 Acute pharyngitis, unspecified: Secondary | ICD-10-CM | POA: Diagnosis not present

## 2023-09-13 DIAGNOSIS — J029 Acute pharyngitis, unspecified: Secondary | ICD-10-CM | POA: Diagnosis not present

## 2023-09-16 ENCOUNTER — Other Ambulatory Visit: Payer: Self-pay | Admitting: Urology

## 2023-09-16 DIAGNOSIS — R972 Elevated prostate specific antigen [PSA]: Secondary | ICD-10-CM

## 2023-10-28 DIAGNOSIS — E785 Hyperlipidemia, unspecified: Secondary | ICD-10-CM | POA: Diagnosis not present

## 2023-10-28 DIAGNOSIS — R7301 Impaired fasting glucose: Secondary | ICD-10-CM | POA: Diagnosis not present

## 2023-10-29 ENCOUNTER — Encounter: Payer: Self-pay | Admitting: Urology

## 2023-11-06 DIAGNOSIS — Z1331 Encounter for screening for depression: Secondary | ICD-10-CM | POA: Diagnosis not present

## 2023-11-06 DIAGNOSIS — R82998 Other abnormal findings in urine: Secondary | ICD-10-CM | POA: Diagnosis not present

## 2023-11-06 DIAGNOSIS — Z1339 Encounter for screening examination for other mental health and behavioral disorders: Secondary | ICD-10-CM | POA: Diagnosis not present

## 2023-11-06 DIAGNOSIS — C9110 Chronic lymphocytic leukemia of B-cell type not having achieved remission: Secondary | ICD-10-CM | POA: Diagnosis not present

## 2023-11-06 DIAGNOSIS — Z0000 Encounter for general adult medical examination without abnormal findings: Secondary | ICD-10-CM | POA: Diagnosis not present

## 2023-11-25 ENCOUNTER — Ambulatory Visit
Admission: RE | Admit: 2023-11-25 | Discharge: 2023-11-25 | Disposition: A | Discharge status: Home / Self Care | Source: Ambulatory Visit | Attending: Urology

## 2023-11-25 DIAGNOSIS — R972 Elevated prostate specific antigen [PSA]: Secondary | ICD-10-CM

## 2023-11-25 DIAGNOSIS — N400 Enlarged prostate without lower urinary tract symptoms: Secondary | ICD-10-CM | POA: Diagnosis not present

## 2023-11-25 DIAGNOSIS — K5730 Diverticulosis of large intestine without perforation or abscess without bleeding: Secondary | ICD-10-CM | POA: Diagnosis not present

## 2023-11-25 MED ORDER — GADOPICLENOL 0.5 MMOL/ML IV SOLN
10.0000 mL | Freq: Once | INTRAVENOUS | Status: AC | PRN
  Administered 2023-11-25: 10 mL via INTRAVENOUS

## 2023-11-27 DIAGNOSIS — N401 Enlarged prostate with lower urinary tract symptoms: Secondary | ICD-10-CM | POA: Diagnosis not present

## 2023-11-27 DIAGNOSIS — R350 Frequency of micturition: Secondary | ICD-10-CM | POA: Diagnosis not present

## 2023-12-04 DIAGNOSIS — R350 Frequency of micturition: Secondary | ICD-10-CM | POA: Diagnosis not present

## 2023-12-04 DIAGNOSIS — N401 Enlarged prostate with lower urinary tract symptoms: Secondary | ICD-10-CM | POA: Diagnosis not present

## 2023-12-04 DIAGNOSIS — R972 Elevated prostate specific antigen [PSA]: Secondary | ICD-10-CM | POA: Diagnosis not present

## 2023-12-31 ENCOUNTER — Ambulatory Visit (HOSPITAL_COMMUNITY)
Admission: RE | Admit: 2023-12-31 | Discharge: 2023-12-31 | Disposition: A | Discharge status: Home / Self Care | Payer: Self-pay | Source: Ambulatory Visit | Attending: Vascular Surgery | Admitting: Vascular Surgery

## 2023-12-31 ENCOUNTER — Other Ambulatory Visit (HOSPITAL_COMMUNITY): Payer: Self-pay | Admitting: Internal Medicine

## 2023-12-31 DIAGNOSIS — Z136 Encounter for screening for cardiovascular disorders: Secondary | ICD-10-CM | POA: Diagnosis not present

## 2024-03-02 ENCOUNTER — Inpatient Hospital Stay (HOSPITAL_BASED_OUTPATIENT_CLINIC_OR_DEPARTMENT_OTHER): Payer: Medicare Other | Admitting: Hematology and Oncology

## 2024-03-02 ENCOUNTER — Other Ambulatory Visit: Payer: Self-pay

## 2024-03-02 ENCOUNTER — Inpatient Hospital Stay: Payer: Medicare Other | Attending: Hematology and Oncology

## 2024-03-02 VITALS — BP 130/76 | HR 52 | Temp 97.7°F | Resp 16 | Ht 72.0 in | Wt 209.3 lb

## 2024-03-02 DIAGNOSIS — C9110 Chronic lymphocytic leukemia of B-cell type not having achieved remission: Secondary | ICD-10-CM | POA: Insufficient documentation

## 2024-03-02 DIAGNOSIS — Z79899 Other long term (current) drug therapy: Secondary | ICD-10-CM | POA: Diagnosis not present

## 2024-03-02 LAB — CBC WITH DIFFERENTIAL (CANCER CENTER ONLY)
Abs Immature Granulocytes: 0.02 K/uL (ref 0.00–0.07)
Basophils Absolute: 0.1 K/uL (ref 0.0–0.1)
Basophils Relative: 0 %
Eosinophils Absolute: 0.2 K/uL (ref 0.0–0.5)
Eosinophils Relative: 1 %
HCT: 40.8 % (ref 39.0–52.0)
Hemoglobin: 14 g/dL (ref 13.0–17.0)
Immature Granulocytes: 0 %
Lymphocytes Relative: 81 %
Lymphs Abs: 15.3 K/uL — ABNORMAL HIGH (ref 0.7–4.0)
MCH: 30.4 pg (ref 26.0–34.0)
MCHC: 34.3 g/dL (ref 30.0–36.0)
MCV: 88.7 fL (ref 80.0–100.0)
Monocytes Absolute: 0.9 K/uL (ref 0.1–1.0)
Monocytes Relative: 5 %
Neutro Abs: 2.5 K/uL (ref 1.7–7.7)
Neutrophils Relative %: 13 %
Platelet Count: 114 K/uL — ABNORMAL LOW (ref 150–400)
RBC: 4.6 MIL/uL (ref 4.22–5.81)
RDW: 13.7 % (ref 11.5–15.5)
WBC Count: 18.9 K/uL — ABNORMAL HIGH (ref 4.0–10.5)
nRBC: 0 % (ref 0.0–0.2)

## 2024-03-02 LAB — CMP (CANCER CENTER ONLY)
ALT: 19 U/L (ref 0–44)
AST: 19 U/L (ref 15–41)
Albumin: 4.4 g/dL (ref 3.5–5.0)
Alkaline Phosphatase: 47 U/L (ref 38–126)
Anion gap: 5 (ref 5–15)
BUN: 24 mg/dL — ABNORMAL HIGH (ref 8–23)
CO2: 25 mmol/L (ref 22–32)
Calcium: 8.9 mg/dL (ref 8.9–10.3)
Chloride: 111 mmol/L (ref 98–111)
Creatinine: 1.41 mg/dL — ABNORMAL HIGH (ref 0.61–1.24)
GFR, Estimated: 55 mL/min — ABNORMAL LOW (ref >60)
Glucose, Bld: 99 mg/dL (ref 70–99)
Potassium: 4.2 mmol/L (ref 3.5–5.1)
Sodium: 141 mmol/L (ref 135–145)
Total Bilirubin: 0.5 mg/dL (ref 0.0–1.2)
Total Protein: 6.2 g/dL — ABNORMAL LOW (ref 6.5–8.1)

## 2024-03-02 LAB — LACTATE DEHYDROGENASE: LDH: 166 U/L (ref 98–192)

## 2024-03-02 NOTE — Progress Notes (Signed)
 Patient Care Team: Loreli Elsie JONETTA Mickey., MD as PCP - General (Internal Medicine) Matilda Senior, MD as Consulting Physician (Urology)  DIAGNOSIS:  Encounter Diagnosis  Name Primary?   Chronic lymphocytic leukemia (HCC) Yes      CHIEF COMPLIANT: Follow-up of CLL  HISTORY OF PRESENT ILLNESS:   History of Present Illness , Walter Scott is a 68 year old male with chronic lymphocytic leukemia (CLL) who presents for routine follow-up and blood work review.  He has no new symptoms or concerns related to his CLL. Recent blood work shows a decrease in white blood cell count compared to previous years. Hemoglobin is stable at 14. Platelet count has fluctuated between 114 and 130 over the past five years. The lymphocyte absolute count has increased from 13 five years ago to 21 currently. He is awaiting today's lymphocyte count results. He is lost significant weight by following [employer] diet     ALLERGIES:  has no known allergies.  MEDICATIONS:  Current Outpatient Medications  Medication Sig Dispense Refill   finasteride (PROSCAR) 5 MG tablet TK 1 T PO QD     rosuvastatin (CRESTOR) 10 MG tablet TK 1 T PO D HS     No current facility-administered medications for this visit.    PHYSICAL EXAMINATION: ECOG PERFORMANCE STATUS: 1 - Symptomatic but completely ambulatory  Vitals:   03/02/24 0831  BP: 130/76  Pulse: (!) 52  Resp: 16  Temp: 97.7 F (36.5 C)  SpO2: 97%   Filed Weights   03/02/24 0831  Weight: 209 lb 4.8 oz (94.9 kg)      LABORATORY DATA:  I have reviewed the data as listed    Latest Ref Rng & Units 02/28/2023    8:46 AM 02/27/2022    8:49 AM 05/06/2020    1:10 PM  CMP  Glucose 70 - 99 mg/dL 897  85  896   BUN 8 - 23 mg/dL 36  35  24   Creatinine 0.61 - 1.24 mg/dL 8.62  8.56  8.72   Sodium 135 - 145 mmol/L 140  139  140   Potassium 3.5 - 5.1 mmol/L 4.4  4.6  4.3   Chloride 98 - 111 mmol/L 110  112  109   CO2 22 - 32 mmol/L 24  22  27    Calcium  8.9 - 10.3 mg/dL 9.1  9.4  9.4   Total Protein 6.5 - 8.1 g/dL 6.6  6.8  6.5   Total Bilirubin 0.3 - 1.2 mg/dL 0.6  0.9  0.5   Alkaline Phos 38 - 126 U/L 49  45  55   AST 15 - 41 U/L 20  20  21    ALT 0 - 44 U/L 17  18  21      Lab Results  Component Value Date   WBC 18.9 (H) 03/02/2024   HGB 14.0 03/02/2024   HCT 40.8 03/02/2024   MCV 88.7 03/02/2024   PLT 114 (L) 03/02/2024   NEUTROABS PENDING 03/02/2024    ASSESSMENT & PLAN:  Chronic lymphocytic leukemia (HCC) Flow Cytometry: 09/15/18: monoclonal B-cell population with expression of CD5 and CD23. Cytogenetics: Bi-allelic deletion of 13q14, No 17p del, No trisomy 12 Labs: 09/19/18: WBC: 18.4, Hb 14.9, Pl 142, ALC: 15.5 01/05/2019: WBC 17.9, hemoglobin 14.7, platelets 128, ALC 13.3 05/07/2019: WBC 19.5, hemoglobin 15.5, platelets 133, ALC 14.4 11/05/2019: WBC: 22.9, ALC: 18.8, platelets 138 02/27/2022: WBC 24.8, hemoglobin 15.3, platelets 130, ALC 19.9 02/28/2023: WBC 24.6, hemoglobin 15.9, platelets  125 03/02/2024: WBC 18.9, hemoglobin 14, platelets 114  [employer] diet: Patient has lost significant amount of weight and staying healthy.  He went to [place] in [place] to play golf Currently patient does not have indications for treatment.    Return to clinic in 1 year for follow-up with labs   Assessment & Plan Chronic lymphocytic leukemia, B-cell type, not in remission CLL well-managed with decreased WBC count. Hemoglobin normal. Platelet counts stable. Lymphocyte absolute count stable. Awaiting current lymphocyte and kidney function results. - Continue annual check-ups with blood work. - Increase hydration to support kidney function. - Await current lymphocyte absolute count and kidney function results.      Orders Placed This Encounter  Procedures   CBC with Differential ([facility] Only)    Standing Status:   Future    Expiration Date:   03/02/2025   CMP ([facility] only)    Standing Status:   Future    Expiration  Date:   03/02/2025   Lactate dehydrogenase    Standing Status:   Future    Expiration Date:   03/02/2025   The patient has a good understanding of the overall plan. he agrees with it. he will call with any problems that may develop before the next visit here. Total time spent: 30 mins including face to face time and time spent for planning, charting and co-ordination of care   Viinay K Oluwadamilare Tobler, MD 03/02/24

## 2024-03-02 NOTE — Assessment & Plan Note (Signed)
 Flow Cytometry: 09/15/18: monoclonal B-cell population with expression of CD5 and CD23. Cytogenetics: Bi-allelic deletion of 13q14, No 17p del, No trisomy 12 Labs: 09/19/18: WBC: 18.4, Hb 14.9, Pl 142, ALC: 15.5 01/05/2019: WBC 17.9, hemoglobin 14.7, platelets 128, ALC 13.3 05/07/2019: WBC 19.5, hemoglobin 15.5, platelets 133, ALC 14.4 11/05/2019: WBC: 22.9, ALC: 18.8, platelets 138 02/27/2022: WBC 24.8, hemoglobin 15.3, platelets 130, ALC 19.9 02/28/2023: WBC 24.6, hemoglobin 15.9, platelets 125    Currently patient does not have indications for treatment.    Return to clinic in 1 year for follow-up with labs

## 2024-05-27 DIAGNOSIS — R972 Elevated prostate specific antigen [PSA]: Secondary | ICD-10-CM | POA: Diagnosis not present

## 2024-06-02 DIAGNOSIS — H524 Presbyopia: Secondary | ICD-10-CM | POA: Diagnosis not present

## 2024-06-02 DIAGNOSIS — H10413 Chronic giant papillary conjunctivitis, bilateral: Secondary | ICD-10-CM | POA: Diagnosis not present

## 2024-06-03 DIAGNOSIS — R351 Nocturia: Secondary | ICD-10-CM | POA: Diagnosis not present

## 2024-06-03 DIAGNOSIS — N401 Enlarged prostate with lower urinary tract symptoms: Secondary | ICD-10-CM | POA: Diagnosis not present

## 2024-06-03 DIAGNOSIS — R972 Elevated prostate specific antigen [PSA]: Secondary | ICD-10-CM | POA: Diagnosis not present

## 2024-06-03 DIAGNOSIS — R350 Frequency of micturition: Secondary | ICD-10-CM | POA: Diagnosis not present

## 2025-03-02 ENCOUNTER — Other Ambulatory Visit

## 2025-03-02 ENCOUNTER — Ambulatory Visit: Admitting: Hematology and Oncology
